# Patient Record
Sex: Male | Born: 1965 | Race: White | Hispanic: No | Marital: Married | Smoking: Former smoker
Health system: Southern US, Community
[De-identification: ages and names within clinical notes are randomized; demographics above are authoritative.]

## PROBLEM LIST (undated history)

## (undated) DIAGNOSIS — Z8719 Personal history of other diseases of the digestive system: Secondary | ICD-10-CM

## (undated) DIAGNOSIS — R531 Weakness: Secondary | ICD-10-CM

## (undated) DIAGNOSIS — F32A Depression, unspecified: Secondary | ICD-10-CM

## (undated) DIAGNOSIS — IMO0001 Reserved for inherently not codable concepts without codable children: Secondary | ICD-10-CM

## (undated) DIAGNOSIS — M1990 Unspecified osteoarthritis, unspecified site: Secondary | ICD-10-CM

## (undated) DIAGNOSIS — F419 Anxiety disorder, unspecified: Secondary | ICD-10-CM

## (undated) DIAGNOSIS — K219 Gastro-esophageal reflux disease without esophagitis: Secondary | ICD-10-CM

## (undated) DIAGNOSIS — Z9889 Other specified postprocedural states: Secondary | ICD-10-CM

## (undated) DIAGNOSIS — M545 Low back pain, unspecified: Secondary | ICD-10-CM

## (undated) DIAGNOSIS — I10 Essential (primary) hypertension: Secondary | ICD-10-CM

## (undated) DIAGNOSIS — F329 Major depressive disorder, single episode, unspecified: Secondary | ICD-10-CM

## (undated) DIAGNOSIS — G5600 Carpal tunnel syndrome, unspecified upper limb: Secondary | ICD-10-CM

## (undated) DIAGNOSIS — R112 Nausea with vomiting, unspecified: Secondary | ICD-10-CM

## (undated) DIAGNOSIS — G4730 Sleep apnea, unspecified: Secondary | ICD-10-CM

## (undated) DIAGNOSIS — G8929 Other chronic pain: Secondary | ICD-10-CM

## (undated) DIAGNOSIS — Z86010 Personal history of colonic polyps: Secondary | ICD-10-CM

## (undated) DIAGNOSIS — T07XXXA Unspecified multiple injuries, initial encounter: Secondary | ICD-10-CM

## (undated) DIAGNOSIS — R351 Nocturia: Secondary | ICD-10-CM

## (undated) DIAGNOSIS — E785 Hyperlipidemia, unspecified: Secondary | ICD-10-CM

## (undated) DIAGNOSIS — G4700 Insomnia, unspecified: Secondary | ICD-10-CM

## (undated) DIAGNOSIS — K760 Fatty (change of) liver, not elsewhere classified: Secondary | ICD-10-CM

## (undated) DIAGNOSIS — D696 Thrombocytopenia, unspecified: Secondary | ICD-10-CM

## (undated) DIAGNOSIS — G4720 Circadian rhythm sleep disorder, unspecified type: Secondary | ICD-10-CM

## (undated) DIAGNOSIS — R296 Repeated falls: Secondary | ICD-10-CM

## (undated) DIAGNOSIS — R3915 Urgency of urination: Secondary | ICD-10-CM

## (undated) DIAGNOSIS — Z8709 Personal history of other diseases of the respiratory system: Secondary | ICD-10-CM

## (undated) DIAGNOSIS — F513 Sleepwalking [somnambulism]: Secondary | ICD-10-CM

## (undated) DIAGNOSIS — M542 Cervicalgia: Secondary | ICD-10-CM

## (undated) DIAGNOSIS — R42 Dizziness and giddiness: Secondary | ICD-10-CM

## (undated) HISTORY — PX: ELBOW SURGERY: SHX618

## (undated) HISTORY — DX: Low back pain, unspecified: M54.50

## (undated) HISTORY — DX: Cervicalgia: M54.2

## (undated) HISTORY — DX: Depression, unspecified: F32.A

## (undated) HISTORY — DX: Carpal tunnel syndrome, unspecified upper limb: G56.00

## (undated) HISTORY — DX: Major depressive disorder, single episode, unspecified: F32.9

## (undated) HISTORY — PX: OTHER SURGICAL HISTORY: SHX169

## (undated) HISTORY — PX: ESOPHAGOGASTRODUODENOSCOPY: SHX1529

## (undated) HISTORY — DX: Low back pain: M54.5

## (undated) HISTORY — DX: Sleep apnea, unspecified: G47.30

## (undated) HISTORY — PX: COLONOSCOPY: SHX174

## (undated) HISTORY — PX: TONSILLECTOMY: SUR1361

## (undated) HISTORY — DX: Other chronic pain: G89.29

---

## 1999-12-19 ENCOUNTER — Other Ambulatory Visit: Admission: RE | Admit: 1999-12-19 | Discharge: 1999-12-19 | Payer: Self-pay | Admitting: Orthopedic Surgery

## 2000-08-31 ENCOUNTER — Other Ambulatory Visit: Admission: RE | Admit: 2000-08-31 | Discharge: 2000-08-31 | Payer: Self-pay | Admitting: Orthopedic Surgery

## 2001-04-27 ENCOUNTER — Encounter: Payer: Self-pay | Admitting: Emergency Medicine

## 2001-04-27 ENCOUNTER — Emergency Department (HOSPITAL_COMMUNITY): Admission: EM | Admit: 2001-04-27 | Discharge: 2001-04-27 | Payer: Self-pay | Admitting: Emergency Medicine

## 2001-07-10 ENCOUNTER — Inpatient Hospital Stay (HOSPITAL_COMMUNITY): Admission: EM | Admit: 2001-07-10 | Discharge: 2001-07-12 | Payer: Self-pay | Admitting: Emergency Medicine

## 2001-07-10 ENCOUNTER — Encounter: Payer: Self-pay | Admitting: Emergency Medicine

## 2003-10-01 ENCOUNTER — Emergency Department (HOSPITAL_COMMUNITY): Admission: EM | Admit: 2003-10-01 | Discharge: 2003-10-01 | Payer: Self-pay | Admitting: Emergency Medicine

## 2003-10-09 ENCOUNTER — Encounter (INDEPENDENT_AMBULATORY_CARE_PROVIDER_SITE_OTHER): Payer: Self-pay | Admitting: Specialist

## 2003-10-09 ENCOUNTER — Ambulatory Visit (HOSPITAL_COMMUNITY): Admission: RE | Admit: 2003-10-09 | Discharge: 2003-10-09 | Payer: Self-pay | Admitting: Gastroenterology

## 2005-06-29 HISTORY — PX: NECK SURGERY: SHX720

## 2006-05-09 ENCOUNTER — Encounter
Admission: RE | Admit: 2006-05-09 | Discharge: 2006-05-09 | Payer: Self-pay | Admitting: Physical Medicine and Rehabilitation

## 2006-06-18 ENCOUNTER — Ambulatory Visit (HOSPITAL_COMMUNITY): Admission: RE | Admit: 2006-06-18 | Discharge: 2006-06-18 | Payer: Self-pay | Admitting: Neurosurgery

## 2006-08-11 ENCOUNTER — Encounter: Admission: RE | Admit: 2006-08-11 | Discharge: 2006-08-11 | Payer: Self-pay | Admitting: Neurosurgery

## 2007-03-02 ENCOUNTER — Encounter
Admission: RE | Admit: 2007-03-02 | Discharge: 2007-03-02 | Payer: Self-pay | Admitting: Physical Medicine and Rehabilitation

## 2007-06-30 HISTORY — PX: BACK SURGERY: SHX140

## 2007-11-06 ENCOUNTER — Emergency Department (HOSPITAL_COMMUNITY): Admission: EM | Admit: 2007-11-06 | Discharge: 2007-11-06 | Payer: Self-pay | Admitting: Emergency Medicine

## 2007-12-09 ENCOUNTER — Inpatient Hospital Stay (HOSPITAL_COMMUNITY): Admission: RE | Admit: 2007-12-09 | Discharge: 2007-12-11 | Payer: Self-pay | Admitting: Neurosurgery

## 2010-02-27 ENCOUNTER — Emergency Department (HOSPITAL_COMMUNITY): Admission: EM | Admit: 2010-02-27 | Discharge: 2010-02-27 | Payer: Self-pay | Admitting: Emergency Medicine

## 2010-09-11 LAB — COMPREHENSIVE METABOLIC PANEL
AST: 83 U/L — ABNORMAL HIGH (ref 0–37)
BUN: 20 mg/dL (ref 6–23)
CO2: 24 mEq/L (ref 19–32)
Chloride: 102 mEq/L (ref 96–112)
Creatinine, Ser: 1.48 mg/dL (ref 0.4–1.5)
GFR calc Af Amer: >60 mL/min (ref >60)
GFR calc non Af Amer: 51 mL/min — ABNORMAL LOW (ref >60)
Glucose, Bld: 126 mg/dL — ABNORMAL HIGH (ref 70–99)
Potassium: 3.5 mEq/L (ref 3.5–5.1)
Sodium: 137 mEq/L (ref 135–145)
Total Bilirubin: 0.6 mg/dL (ref 0.3–1.2)
Total Protein: 7 g/dL (ref 6.0–8.3)

## 2010-09-11 LAB — CBC
MCH: 34.8 pg — ABNORMAL HIGH (ref 26.0–34.0)
MCHC: 36.1 g/dL — ABNORMAL HIGH (ref 30.0–36.0)
MCV: 96.4 fL (ref 78.0–100.0)
Platelets: 148 10*3/uL — ABNORMAL LOW (ref 150–400)
WBC: 5.7 10*3/uL (ref 4.0–10.5)

## 2010-09-11 LAB — PROTIME-INR
INR: 1.02 (ref 0.00–1.49)
Prothrombin Time: 13.6 seconds (ref 11.6–15.2)

## 2010-11-11 NOTE — Op Note (Signed)
[name], [name]          ACCOUNT NO.:  [id number]   MEDICAL RECORD NO.:  [id number]          PATIENT TYPE:  INP   LOCATION:  3012                         FACILITY:  [facility]   PHYSICIAN:  Kathaleen Maser. Pool, M.D.    DATE OF BIRTH:  01-18-1966   DATE OF PROCEDURE:  12/09/2007  DATE OF DISCHARGE:                               OPERATIVE REPORT   PREOPERATIVE DIAGNOSIS:  L4-5 and L5-S1 degenerative disk disease and  spondylosis with foraminal stenosis.   POSTOPERATIVE DIAGNOSIS:  L4-5 and L5-S1 degenerative disk disease and  spondylosis with foraminal stenosis.   PROCEDURES:  L4-5 and L5-S1 decompressive laminectomy with L4, L5, and  S1 foraminotomies, more than what would be required for simple interbody  fusion alone.  L4-5 and L5-S1 posterior lumbar with utilizing tangent  interbody allograft wedge, Telamon nterbody PEEK cage and local  autografting.  L4-5 posterolateral arthrodesis utilizing segmental  pedicle fixation and local autograft.   SURGEON:  Kathaleen Maser. Pool, MD.   ASSISTANT:  Tia Alert, MD   ANESTHESIA:  General endotracheal.   INDICATION:  Mr. Brander is a 45 year old male with a history of severe  back and left lower extremity pain, paresthesias, weakness, failing  conservative management.  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  The patient presents now for 2-  level lumbar decompression and fusion with instrumentation.   OPERATIVE PROCEDURE:  The patient placed on the operative table in  supine position.  After a level of anesthesia achieved, the patient was  placed prone on Wilson frame firmly padded  The patient's lumbar regions  prepped and draped.  A 10-blade was used to make a curvilinear skin  incision overlying the L3-4, L5, and S1 levels.  This was carried down  sharply in midline.  Subperiosteal dissection was then performed  exposing  the lamina and facet joints L3-L4, L5, and S1 as well as the  transverse processes of L4-L5 and there was a S1 bilaterally.  Deep self-  retaining retractor was placed.  Intraoperative fluoroscopy was used and  levels were confirmed.  Decompressive laminectomies were then performed  using Leksell rongeurs, Kerrison rongeurs, and high-speed drill to  completely remove the lamina at L4, completely remove the lamina of L5,  and removed the superior aspect of lamina of S1.  Inferior facetectomies  were performed bilaterally at L4-L5.  Superior facetectomies performed  bilaterally at L5-S1.  Ligament flavum was then elevated and resected.  These  fashion using Kerrison rongeurs.  Wide decompressive  foraminotomies were then performed along course exiting L4, L5, and S1  nerve roots.  Epidural venous plexus were coagulated and cut.  Starting  first at L4-5 on the right side, thecal sac and nerve roots gently  mobilized and tracked towards the left.  The disk space was incised with  15 blade in a rectangular fashion.  Wide disk space clean-out was  achieved using pituitary rongeurs, upbiting pituitary rongeurs, and  Epstein curettes.  Procedure was then repeated on the contralateral  side.  Disk space then  sequentially dilated up to 10 mm.  The 10-mm  distractor was left in the patient's left side.  Thecal sac and nerve  roots were checked on the right side.  Disk space then reamed and cut  with 10 mm tangent instruments.  Soft tissues were then removed from the  interspace.  A 10 x 26 mm  Telamon cage packed with morselized autograft  and Progenix putty was then packed into place and recessed roughly 1 mm  from posterior cortical margin of L4.  The distractor was removed from  the patient's left side.  Thecal sac and nerve roots were checked on the  left side.  Disk space was once again reamed and then cut with a 10-mm  tangent instrument. Soft tissues then removed from the interspace.  Disk   space was further curettaged.  Morselized autograft mixed with Progenix  putty was then packed in the interspace. A 10 x 26 tangent wedge was  then packed into place and recessed approximately 1 mm from posterior  cortical margin at L4.  The procedure then repeated at L5-S1 again  without complication.  Pedicle screws were placed bilaterally at L4, L5,  and S1 by first selecting entry point using surface landmarks and  intraoperative fluoroscopy.  The intracord was then drilled using high-  speed drill.  The intracord was then widened using a pedicle awl.  Each  pedicle awl track was then tapped with 4.25 mm screw tap.  Screw tap  hole was probed and found to be solid within the bone.  A 6.75 x 45 mm  radius screws placed bilaterally at L4.  6.75 x 40 mm screws placed  bilaterally at L5 and S1.  Transverse processes and sacral ala were then  decorticated with high speed drill.  Morselized autograft was packed  posterolaterally for later fusion.  Short segment titanium rods were  then contoured, placed over screw heads at L4-5 and S1.  Locking caps  placed over screw heads.  The locking caps were then engaged with  construct under compression.  Transverse connector was placed.  Final  images revealed good position of the bone grafts and hardware with  normal alignment of the spine. Wound was then irrigated with antibiotic  solution.  Gelfoam was placed topically.  Hemostasis found to be good.  A medium Hemovac drain was left at upper spacer.  Wound was then closed  in layers with Vicryl suture.  Steri-Strips and sterile dressings were  applied.  There were no complications.  The patient tolerated the  procedure well.  He returned to recovery room for postoperative care.           ______________________________  Kathaleen Maser. Pool, M.D.     HAP/MEDQ  D:  12/09/2007  T:  12/10/2007  Job:  [id number]

## 2010-11-14 NOTE — H&P (Signed)
[facility]  Patient:    ZYAIR, RUSSI Visit Number: [id number] MRN: [id number]          Service Type: MED Location: 1E 0101 01 Attending Physician:  Donnetta Hutching Dictated by:   Veneda Melter, M.D. Admit Date:  07/10/2001   CC:         Louanna Raw, M.D.   History and Physical  BRIEF HISTORY:  Mr. Sheeley is a 45 year old white male without known history of coronary disease who presents with substernal chest discomfort. The patient has had a 3 to 4 month history of dyspnea with exertion and mild substernal chest discomfort also with exertion for which he has not sought medical attention.  In the past 2 days he has noted several fleeting episodes at rest and this morning while working on his computer had significant substernal chest pain with radiation to the back.  This was associated with diaphoresis, lightheadedness, nausea and shortness of breath.  The patient could not quite get his wind.  He presented to the emergency room and was stabilized medically.  He is currently completely pain free.  In retrospect Mr. Maull recalls having some palpitations with exertion.  He felt light-headed this morning but has not had any prior episodes of lightheadedness, no syncope nor orthopnea, paroxysmal nocturnal dyspnea, lower extremity edema.  He has not had any transient ischemic attacks or strokes.  REVIEW OF SYSTEMS:  Otherwise noncontributory.  PAST MEDICAL HISTORY: 1. Notable for hypertension, hypercholesterolemia.  He has had total    cholesterol of 331 with triglycerides of 1327 approximately 1 year ago.  He    had been tried on lipitor and did not feel well on this and has been on    Welchol since without recheck. 2. Several bilateral elbow and forearm surgeries. 3. He also has a history of migraines. 4. Gastroesophageal reflux disease.  ALLERGIES:  SULFA which causes a rash.  ANCEF also causes rash and hot flashes.  CURRENT  MEDICATIONS: 1. Norvasc 5 mg q.d. 2. Protonix 40 mg q.d. 3. Welchol 6 tablets q.d. 4. Zoloft 150 to 200 mg q.d. 5. Midrin and Valium as needed for migraines.  SOCIAL HISTORY:  The patient is married and has 1 daughter 19 years old.  He has a history of tobacco use of 1 pack per week for 22 years.  Quit 3 years ago.  He uses alcohol in moderation 1 to 2 drinks per day, more on weekends but denies excessive alcohol use.  He denies elicit drug use.  FAMILY HISTORY:  Father alive at the age of 40, had 3 myocardial infarctions. He has had coronary artery bypass graft surgery as well. Mother is alive, unknown age.  No history of coronary disease.  He has 1 brother with hypertension and hypercholesterolemia.  He has several aunts and uncles with coronary artery disease.  PHYSICAL EXAMINATION:  GENERAL:  He is a well-developed well-nourished white male in no acute distress.  VITAL SIGNS:  Blood pressure 120/80, pulse 64 normal sinus, respirations 18, O2 saturation 99% on 2 liters per minute.  HEENT:  Pupils, equal, round, reactive to light.  Extraocular muscles intact. Oropharynx shows no lesions.  NECK:  Carotid upstrokes are brisk without bruits.  There is no cervical adenopathy.  HEART:  Regular rate and rhythm without murmurs.  LUNGS:  Clear to auscultation.  ABDOMEN:  Soft, nontender, no masses.  EXTREMITIES:  There is no peripheral edema.  Pulses are 3+ and equal bilaterally.  Motor strength is 5/5,  sensory is intact to touch.  CRANIAL NERVES II-XII:  Intact.  SKIN:  No rashes.  LABORATORY DATA:  CK is 78, MB fraction is 1.0, troponin I is less than 0.01. White count 6.1, hemoglobin 15.8, hematocrit 44.8, platelets 233, sodium 137, potassium 4.1, chloride 107.  Bicarbonate 25, BUN 13, creatinine 1.3, glucose of 98.  ECG shows normal sinus rhythm at 68 with no acute ischemic changes.  ASSESSMENT AND PLAN:  The patient is a 45 year old gentleman without prior cardiac  history who presents with a 3 to 4 month history of progressive shortness of breath and chest pain with exertion.  This is now progressed to pain at rest with nausea, diaphoresis, palpitations and shortness of breath.  The patient has a significant history of dyslipidemia, hypertension and a strong family history of coronary disease.  Given these findings he has high pretest probability at this point.  Initial enzymes and ECGs do suggest any acute injury or ischemia. The patient will be admitted to the hospital.  He will be stabilized medically and ruled out for acute myocardial infarction. We will then proceed with cardiac catheterization.  The patient is familiar with treatment options including stress imaging studies and he wishes to proceed with this.  The risks, benefits and alternatives of left heart catheterization and possible percutaneous intervention were discussed with the patient and his wife.  They understand and wish to proceed. Will also repeat his fasting lipid profile and if this grossly abnormal consider referral to our lipid clinic for further treatment. Dictated by:   Veneda Melter, M.D. Attending Physician:  Donnetta Hutching DD:  07/10/01 TD:  07/10/01 Job: [id number]

## 2010-11-14 NOTE — Op Note (Signed)
NAME:  [name], [name]                    ACCOUNT NO.:  [id number]   MEDICAL RECORD NO.:  [id number]                   PATIENT TYPE:  AMB   LOCATION:  ENDO                                 FACILITY:  [facility]   PHYSICIAN:  John C. Madilyn Fireman, M.D.                 DATE OF BIRTH:  1965-09-14   DATE OF PROCEDURE:  10/09/2003  DATE OF DISCHARGE:                                 OPERATIVE REPORT   PROCEDURE:  Colonoscopy with biopsy.   INDICATIONS FOR PROCEDURE:  Abdominal pain and diarrhea.   DESCRIPTION OF PROCEDURE:  The patient was placed in the left lateral  decubitus position and placed on the pulse monitor with continuous low-flow  oxygen delivered by nasal cannula.  He was sedated with 50 mcg IV fentanyl  and 4 mg IV Versed in addition to that for the previous EGD.  The Olympus  video colonoscope was inserted into the rectum and advanced to the cecum,  confirmed by transillumination of McBurney's point and visualization of the  ileocecal valve and appendiceal orifice.  Prep was excellent.  The terminal  ileum was intubated and about the distal 3-4 cm visualized which appeared to  be within normal limits.  The cecum, ascending, transverse, descending,  sigmoid colon all appeared normal with no masses, polyps, diverticula, or  other mucosal abnormalities.  The rectum likewise appeared normal and rectal  biopsies were obtained to rule out microscopic colitis.  The scope was then  withdrawn and the patient returned to the recovery room in stable condition.  He tolerated the procedure well and there were no immediate complications.   IMPRESSION:  Normal colonoscopy.   PLAN:  Await biopsy results to rule out microscopic colitis.                                               John C. Madilyn Fireman, M.D.    JCH/MEDQ  D:  10/09/2003  T:  10/09/2003  Job:  [id number]   cc:   Louanna Raw  [address]  [place]  [place] [address]  Fax: [phone]

## 2010-11-14 NOTE — Cardiovascular Report (Signed)
[place]  Patient:    [name], [name] Visit Number: [id number] MRN: [id number]          Service Type: MED Location: 3W [phone] Attending Physician:  Veneda Melter Dictated by:   Doylene Canning. Ladona Ridgel, M.D. [facility] Proc. Date: 07/11/01 Admit Date:  07/10/2001 Discharge Date: 07/11/2001   CC:         Louanna Raw, M.D.  Veneda Melter, M.D. [facility]   Cardiac Catheterization  PROCEDURE:  Left heart catheterization, coronary angiography, and left ventriculography.  INDICATIONS:  Recurrent and unexplained chest pain in a patient with multiple risk factors for coronary disease.    I. INTRODUCTION:  The patient is a very pleasant 45 year old man with a      positive family history of coronary artery disease who was admitted to      the hospital with substernal chest pain.  He is now referred for left      heart catheterization.  II. DESCRIPTION OF PROCEDURE:  After informed consent was obtained, the      patient was taken to the diagnostic catheterization lab in the fasting      state.  After the usual preparation and draping, intravenous midazolam      was given for sedation.  Lidocaine, 20 cc, was infiltrated in the right      groin region.  The right femoral artery was punctured and a #6 [place]      hemostatic sheath was placed in the artery.  The left Judkins catheter      was inserted percutaneously in the right femoral artery and advanced      into the left main coronary artery.  Coronary angiography of the left      main system was then carried out.  The left Judkins catheter was      removed and the right Judkins catheter was inserted into the right      coronary artery, and coronary angiography of the right coronary system      was carried out.  Next, the right Judkins catheter was removed and the      pigtail catheter was inserted retrograde across the aortic valve into      the left ventricle.  Left ventriculography in the RAO projection was     then carried out.  At this point, the catheters were removed.      Hemostasis was assured and the patient was returned to his room in      satisfactory condition. III. COMPLICATIONS:  None.  IV. RESULTS:      A. Hemodynamics:  Left ventricular pressure was 122/11.  The aortic         pressure was 123/84.      B. Left ventriculography:  Left ventriculography was performed in the         RAO projection with a total of 30 cc of contrast delivered at a rate         of 12 cc per second.  The LV function was normal with no segmental         wall motion abnormalities.      C. Coronary angiography:  The left main coronary artery gave rise to the         left anterior descending and left circumflex coronary arteries.  The         left anterior descending was free of significant angiographic disease.         It gave rise to  two diagonal branches which were also free of         significant coronary disease.  The left circumflex branch gave rise         to three obtuse marginal branches, all of whom were free of         significant coronary disease.  The right coronary artery was a         dominant vessel.  It was somewhat diminutive in the segment of the         PDA but no obvious focal stenosis.   V. This study demonstrates normal coronary arteries, preserved LV systolic      function with normal LV end diastolic filling pressures in a patient with      recurrent chest pain and multiple risk factors for coronary artery      disease.Dictated by:   Doylene Canning. Ladona Ridgel, M.D. [facility] Attending Physician:  Veneda Melter DD:  07/11/01 TD:  07/11/01 Job: [id number]

## 2010-11-14 NOTE — Discharge Summary (Signed)
[place]  Patient:    CYAN, MOULTRIE Visit Number: [id number] MRN: [id number]          Service Type: MED Location: 3W [phone] Attending Physician:  Veneda Melter Dictated by:   Lavella Hammock, P.A.-C. Admit Date:  07/10/2001 Discharge Date: 07/11/2001   CC:         Dr. Earlene Plater, [facility], [place], [place]   Referring Physician Discharge [facility]  DATE OF BIRTH:  Oct 14, 1965  PROCEDURES: 1. Cardiac catheterization. 2. Coronary arteriogram. 3. Left ventriculogram.  HOSPITAL COURSE:  Mr. Fandrich is a 45 year old male with no known history of coronary artery disease, who came to the emergency room for evaluation of chest pain associated with mild shortness of breath.  He was admitted to rule out MI and for further evaluation.  Because of the history of premature coronary artery disease in multiple relatives as well as a personal history of hypertension and hypertriglyceridemia, he was scheduled for a cardiac catheterization.  The cardiac catheterization was done on July 11, 2001. The cardiac catheterization showed normal coronary arteries with preserved left ventricular function and normal ______  filling pressures.  The patient had no further recurrence of pain, and his laboratory values were within normal limits.  A TSH was checked, and it was normal, and an ______ was mildly elevated at 48, but other LFTs were within normal limits.  His white count was not high.  Because the patient had no coronary artery disease by catheterization with normal left ventricular function, pending completion of bed rest and his groin being stable after ambulation, he was considered stable for discharge on July 11, 2001.  LABORATORY VALUES:  Total cholesterol 235, triglycerides 559, HDL 38, LDL not calculated.  DISCHARGE CONDITION:  Stable.  CONSULTS:  None.  COMPLICATIONS:  None.  DISCHARGE DIAGNOSES: 1. Chest pain, no coronary  artery disease disease by catheterization, possibly    secondary to reflux. 2. Hypertension. 3. Hyperlipidemia. 4. Family history of premature coronary artery disease. 5. History of ALLERGIES to SULFA and ANCEF.  DISCHARGE INSTRUCTIONS: 1. His is to do no driving, sexual or strenuous activity  for two days. 2. He is to stick to a low fat diet. 3. He is to call the office for problems with the cath site. 4. He has an appointment on July 25, 2001, at 10:15, with Dian Queen for    Dr. Chales Abrahams which he may cancel if he sees Dr. Earlene Plater in the meantime. 5. He is to follow up with Dr. Earlene Plater at [facility] and call for    an appointment.  DISCHARGE MEDICATIONS: 1. Norvasc 5 mg q.d. 2. Protonix 40 mg q.d. 3. Welchol 6 tablets q.d. 4. Zoloft 150-100 mg q.d. 5. Altace 2.5 mg q.d. which is new. Dictated by:   Lavella Hammock, P.A.-C. Attending Physician:  Veneda Melter DD:  07/11/00 TD:  07/11/01 Job: [id number]

## 2010-11-14 NOTE — Op Note (Signed)
NAME:  [name], [name]                    ACCOUNT NO.:  [id number]   MEDICAL RECORD NO.:  [id number]                   PATIENT TYPE:  AMB   LOCATION:  ENDO                                 [facility]ACILITY[facility]   PHYSICIAN:  John C. Madilyn Fireman, M.D.                 DATE OF BIRTH:  09-30-1965   DATE OF PROCEDURE:  10/09/2003  DATE OF DISCHARGE:                                 OPERATIVE REPORT   PROCEDURE:  Esophagogastroduodenoscopy with biopsy.   INDICATION FOR PROCEDURE:  Abdominal pain, reflux symptoms with known  gallstones and suspicion of cirrhosis of the liver as well as chronic  diarrhea.   DESCRIPTION OF PROCEDURE:  The patient was placed in the left lateral  decubitus position and placed on the pulse monitor with continuous low-flow  oxygen delivered by nasal cannula.  He was sedated with 100 mcg IV fentanyl  and 10 mg IV Versed.  The Olympus video endoscope was advanced under direct  vision into the oropharynx and esophagus.  The esophagus was straight and of  normal caliber with the squamocolumnar line at 36 cm above a 3 cm hiatal  hernia.  There was no visible esophagitis and no visible esophageal varices.  The stomach was entered, and a small amount of liquid secretions were  suctioned from the fundus.  Retroflexed view of the cardia was confirmed a  hiatal hernia and was otherwise unremarkable.  The fundus and body appeared  normal.  The antrum showed patches of erythema and granularity with no  discrete ulcer or erosion.  The pylorus was nondeformed and easily allowed  passage of the endoscope tip into the duodenum.  Both the bulb and second  portion were well-inspected and appeared to be within normal limits.  The  scope was withdrawn back into the stomach and CLOtest obtained.  The scope  was then withdrawn and the patient returned to the recovery room in stable  condition.  He tolerated the procedure well, and there were no immediate  complications.   IMPRESSION:  1.  Hiatal hernia with patulous gastroesophageal junction.  2. Antral gastritis.  3. No visible esophageal varices.   PLAN:  1. Increase Nexium to b.i.d. dosing and await CLOtest.  2. Will proceed with colonoscopy based on his abdominal pain and diarrhea to     rule out inflammatory bowel disease.                                               John C. Madilyn Fireman, M.D.    JCH/MEDQ  D:  10/09/2003  T:  10/09/2003  Job:  [id number]   cc:   Louanna Raw  [address]  [place]  [place] [address]  Fax: [phone]

## 2010-11-14 NOTE — Discharge Summary (Signed)
[place]. [facility]  Patient:    [name], [name] Visit Number: [id number] MRN: [id number]          Service Type: OBV Location: [id number] Attending Physician:  Lewayne Bunting Dictated by:   Delton See, P.A. Admit Date:  07/11/2001 Discharge Date: 07/12/2001   CC:         [facility]   Referring Physician Discharge [place]  ADDENDUM  DATE OF BIRTH:  12/24/1965  HISTORY:  Please see previous dictation.  [name], in brief, is a 45 year old male who was admitted for evaluation of chest pain.  He underwent cardiac catheterization on July 11, 2001 revealing normal coronary arteries and normal left ventricle.  He has a history of hypertension.  He had some nonsustained ventricular tachycardia during his stay; however, with a normal left ventricle it was not felt that this needed to be treated at this time. The patient has previously been intolerant to BETA BLOCKERS secondary to fatigue.  He also has a history of elevated cholesterols and triglycerides, and gastroesophageal reflux disease.  HOSPITAL COURSE:  The patient developed a right groin hematoma following his catheterization.  He was to be discharged that evening.  However, because of the hematoma he was kept an additional evening.  An ultrasound was performed on that artery the following day.  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  Arrangements were made to discharge the patient home later that day.  DISCHARGE MEDICATIONS: 1. Norvasc 5 mg daily. 2. Protonix 40 mg daily. 3. WelChol six tablets daily. 4. Zoloft as previously taken. 5. Midrin and Valium as previously taken.  ACTIVITY:  The patient was told to avoid any strenuous activity or driving for two days.  He was not to do any lifting for at least one weeks.  DIET:  He was to be on a low salt/low fat diet.  WOUND CARE:  He told to call the office if he had any  increased pain, swelling, or bleeding from his groin.  FOLLOW-UP:  The patient was scheduled to be seen in the lipid clinic on Monday, July 25, 2001 at 2 p.m.  He would follow up with Dr. Chales Abrahams August 15, 2001 at 2:15 p.m.  He was to see Dr. Louanna Raw as needed or as scheduled.  PROBLEM LIST AT TIME OF DISCHARGE: 1. Cardiac catheterization on July 11, 2001 revealing normal coronary    arteries, normal left ventricle. 2. History of hypertension. 3. Nonsustained ventricular tachycardia this admission. 4. History of elevated cholesterol and triglycerides. 5. Mild hypokalemia, treated. 6. Post catheterization hematoma of the right groin with ultrasound revealing    arteriovenous fistula but no pseudoaneurysm. 7. History of gastroesophageal reflux disease. 8. History of migraine headaches.  NOTE:  The patient probably should have a basic metabolic panel checked at his next office visit secondary to the hypokalemia he was noted to have during this stay.  As previously dictated, the patient did receive potassium supplement. Dictated by:   Delton See, P.A. Attending Physician:  Lewayne Bunting DD:  07/12/01 TD:  07/12/01 Job: [id number]

## 2010-11-14 NOTE — Op Note (Signed)
[name], [name]          ACCOUNT NO.:  [id number]   MEDICAL RECORD NO.:  [id number]          PATIENT TYPE:  AMB   LOCATION:  SDS                          FACILITY:  [facility]   PHYSICIAN:  Henry A. Pool, M.D.    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   PROCEDURE NAME:  C5-6 anterior cervical diskectomy and fusion with  allograft and plating.   SURGEON:  Kathaleen Maser. Pool, M.D.   ASSISTANT:  Donalee Citrin, M.D.   ANESTHESIA:  General orotracheal.   INDICATIONS:  [name] is a 45 year old male with history of neck pain  and bilateral upper extremity symptoms, consistent with both C6  radiculopathy bilaterally and some degree of a cervical myelopathy as  well.  Workup has demonstrated evidence of a broad-based disk herniation  with associated spondylosis biased off to the right side at C5-6, with  spinal stenosis and neural foraminal stenosis bilaterally.  The patient  had been counseled as to his options.  He has decided to proceed with C5-  6 anterior cervical diskectomy and fusion with allograft and anterior  plating -- in hopes of improving his symptoms.   OPERATIVE NOTE:  The patient was brought to the operating room; placed  supine on the table.  After adequate level of anesthesia achieved, with  the patient supine and the neck slightly extended and held in place with  halter traction.  The patient's anterior cervical region was prepped and  draped sterilely.  A #10 blade was used to make a linear skin incision  overlying the C5-6 interspace.  This was carried down sharply to the  platysma.  The platysma was then divided vertically and dissection  proceeded along the medial border of the sternomastoid muscle and  carotid sheath.  Trachea  and esophagus were mobilized and retracted  towards the left.  Prevertebral fascia was stripped off the anterior  spinal column.  The longus coli muscle was then elevated bilaterally  using electrocautery.  Deep self-retaining retractor placed.  Intraoperative fluoroscopy was used and C5-6 level was confirmed.  Disk  space was then incised with a 15 blade in a rectangular fashion.  A wide  disk space clean-out was achieved using pituitary rongeurs, __________  curets, Kerrison rongeurs and a high-speed drill.  All elements of the  disk were removed down to the level of posterior annulus.  Microscope  was then brought into the field and used throughout the remainder of the  diskectomy.  Remaining aspects of the annulus and osteophytes were  removed using the high-speed drill, down to the level of the posterior  longitudinal ligament.  The posterior longitudinal ligament was then  elevated and resected in piecemeal fashion, using Kerrison rongeurs.  The underlying thecal sac was then identified.  A wide central  decompression was then performed by undercutting the bodies of C5 and  C6, completely removing the disk herniation and spondylitic protrusion.  Decompression then proceeded out at each neural foramen.  Wide anterior  foraminotomy was then performed along the course exiting C6 nerve roots  bilaterally.  At this point a very thorough decompression was then  achieved.  There was no evidence of injury to the thecal sac or nerve  roots.  The wound was then irrigated with antibiotic solution.  A 6 mm  __________ allograft wedge was then packed into place and resected  approximately 1 ml from the anterior cortical margin at C5-6.  A 25 mm  Atlantis anterior cervical plate was then placed over the C5 and C6  levels.  This was then attached under fluoroscopic guidance using 13 mm  variable screws; 2 each at both levels.  Final images revealed good  position __________  spine.  Locking screws  were engaged at both levels.  The wound was then inspected for hemostasis and found to be good.  It  was then closed in typical fashion.  Steri-Strips and sterile dressing  were applied.  There were no apparent complications.  The patient well  and he returns to recovery room postoperatively.           ______________________________  Kathaleen Maser Pool, M.D.     HAP/MEDQ  D:  06/18/2006  T:  06/18/2006  Job:  [id number]

## 2011-03-16 ENCOUNTER — Other Ambulatory Visit: Payer: Self-pay | Admitting: Physical Medicine and Rehabilitation

## 2011-03-16 DIAGNOSIS — M4800 Spinal stenosis, site unspecified: Secondary | ICD-10-CM

## 2011-03-16 DIAGNOSIS — M75100 Unspecified rotator cuff tear or rupture of unspecified shoulder, not specified as traumatic: Secondary | ICD-10-CM

## 2011-03-26 LAB — CBC
MCHC: 35.7
RBC: 4.47
RDW: 12.6
WBC: 6.4

## 2011-03-26 LAB — BASIC METABOLIC PANEL
CO2: 27
Calcium: 10.3
Creatinine, Ser: 1.2
GFR calc non Af Amer: >60
Potassium: 4.4

## 2011-03-26 LAB — DIFFERENTIAL
Basophils Absolute: 0
Basophils Relative: 0
Lymphocytes Relative: 23
Neutro Abs: 4.5

## 2011-03-26 LAB — TYPE AND SCREEN
ABO/RH(D): O NEG
Antibody Screen: NEGATIVE

## 2011-05-21 ENCOUNTER — Emergency Department (HOSPITAL_COMMUNITY): Payer: PRIVATE HEALTH INSURANCE

## 2011-05-21 ENCOUNTER — Emergency Department (HOSPITAL_COMMUNITY)
Admission: EM | Admit: 2011-05-21 | Discharge: 2011-05-21 | Disposition: A | Discharge status: Home / Self Care | Payer: PRIVATE HEALTH INSURANCE | Attending: Emergency Medicine | Admitting: Emergency Medicine

## 2011-05-21 ENCOUNTER — Encounter: Payer: Self-pay | Admitting: Emergency Medicine

## 2011-05-21 DIAGNOSIS — R109 Unspecified abdominal pain: Secondary | ICD-10-CM | POA: Insufficient documentation

## 2011-05-21 DIAGNOSIS — R5381 Other malaise: Secondary | ICD-10-CM | POA: Insufficient documentation

## 2011-05-21 DIAGNOSIS — I10 Essential (primary) hypertension: Secondary | ICD-10-CM | POA: Insufficient documentation

## 2011-05-21 DIAGNOSIS — R29898 Other symptoms and signs involving the musculoskeletal system: Secondary | ICD-10-CM

## 2011-05-21 DIAGNOSIS — M129 Arthropathy, unspecified: Secondary | ICD-10-CM | POA: Insufficient documentation

## 2011-05-21 DIAGNOSIS — R1110 Vomiting, unspecified: Secondary | ICD-10-CM

## 2011-05-21 DIAGNOSIS — R55 Syncope and collapse: Secondary | ICD-10-CM | POA: Insufficient documentation

## 2011-05-21 DIAGNOSIS — Z79899 Other long term (current) drug therapy: Secondary | ICD-10-CM | POA: Insufficient documentation

## 2011-05-21 DIAGNOSIS — J45909 Unspecified asthma, uncomplicated: Secondary | ICD-10-CM | POA: Insufficient documentation

## 2011-05-21 DIAGNOSIS — E119 Type 2 diabetes mellitus without complications: Secondary | ICD-10-CM | POA: Insufficient documentation

## 2011-05-21 DIAGNOSIS — R197 Diarrhea, unspecified: Secondary | ICD-10-CM | POA: Insufficient documentation

## 2011-05-21 DIAGNOSIS — E785 Hyperlipidemia, unspecified: Secondary | ICD-10-CM | POA: Insufficient documentation

## 2011-05-21 DIAGNOSIS — K219 Gastro-esophageal reflux disease without esophagitis: Secondary | ICD-10-CM | POA: Insufficient documentation

## 2011-05-21 DIAGNOSIS — R112 Nausea with vomiting, unspecified: Secondary | ICD-10-CM | POA: Insufficient documentation

## 2011-05-21 HISTORY — DX: Essential (primary) hypertension: I10

## 2011-05-21 HISTORY — DX: Unspecified osteoarthritis, unspecified site: M19.90

## 2011-05-21 HISTORY — DX: Hyperlipidemia, unspecified: E78.5

## 2011-05-21 LAB — DIFFERENTIAL
Basophils Absolute: 0 10*3/uL (ref 0.0–0.1)
Lymphocytes Relative: 9 % — ABNORMAL LOW (ref 12–46)
Lymphs Abs: 0.7 10*3/uL (ref 0.7–4.0)
Monocytes Absolute: 0.3 10*3/uL (ref 0.1–1.0)
Monocytes Relative: 4 % (ref 3–12)
Neutro Abs: 6.8 10*3/uL (ref 1.7–7.7)

## 2011-05-21 LAB — CBC
HCT: 45.2 % (ref 39.0–52.0)
Hemoglobin: 16.7 g/dL (ref 13.0–17.0)
RBC: 4.72 MIL/uL (ref 4.22–5.81)
RDW: 12.6 % (ref 11.5–15.5)
WBC: 7.9 10*3/uL (ref 4.0–10.5)

## 2011-05-21 LAB — COMPREHENSIVE METABOLIC PANEL
AST: 49 U/L — ABNORMAL HIGH (ref 0–37)
CO2: 22 mEq/L (ref 19–32)
Chloride: 99 mEq/L (ref 96–112)
Creatinine, Ser: 1.09 mg/dL (ref 0.50–1.35)
GFR calc non Af Amer: 79 mL/min — ABNORMAL LOW (ref >90)
Total Bilirubin: 1 mg/dL (ref 0.3–1.2)

## 2011-05-21 MED ORDER — PROMETHAZINE HCL 25 MG PO TABS: 25.0000 mg | ORAL_TABLET | Freq: Four times a day (QID) | ORAL | Status: AC | PRN

## 2011-05-21 NOTE — ED Notes (Signed)
Pt reports that he has noticed blood in stool as well

## 2011-05-21 NOTE — ED Notes (Addendum)
Pt to ED for eval of n/v/d/ syncope; pt reports that he has been having these episodes every few months; pt reports that he starts to have a metallic taste in his mouth (which started yesterday), then he this AM at 6, he reports that he started to have n/v/d; pt has not been able to keep food down, last time eaten was last night; pt also reports that he has been loosing the use of his R arm which started 6 months; pt reports that he has seen MD in past- and was told to take immodium, drink fluids, and he had some blood/urine tests done; pt is a&oX4, grips equal, pt able to MAE and push against resistance

## 2011-05-21 NOTE — ED Provider Notes (Signed)
History     CSN: [id number] Arrival date & time: 05/21/2011  5:07 PM   First MD Initiated Contact with Patient 05/21/11 1737      Chief Complaint  Patient presents with  . Nausea  . Diarrhea  . Loss of Consciousness    syncope- last time was on Monday    (Consider location/radiation/quality/duration/timing/severity/associated sxs/prior treatment) HPI Comments: For the past year, patient has been having episodes of severe diarrhea, abd cramping.  This returned yesterday.  Also he has been losing strength/use of his right arm over this same time period.  He has less strength and less fine motor skills than before and is getting worse as well.  Had MRI several months back that showed pinched nerve but was not thought to be the cause of this.  Patient is a 45 y.o. male presenting with diarrhea and syncope. The history is provided by the patient.  Diarrhea The primary symptoms include fatigue, abdominal pain, nausea, vomiting and diarrhea. Primary symptoms do not include fever or weight loss. The illness began 2 days ago. The onset was sudden. The problem has been rapidly worsening.  The illness does not include chills.  Loss of Consciousness Associated symptoms include abdominal pain.    Past Medical History  Diagnosis Date  . Hypertension   . Reflux   . Hyperlipemia   . Diabetes mellitus     borderline- was recently started on meds  . Arthritis   . Asthma     mild    Past Surgical History  Procedure Date  . Back surgery   . Elbow surgery   . Neck surgery     fusion    History reviewed. No pertinent family history.  History  Substance Use Topics  . Smoking status: Former Smoker -- 20 years    Quit date: 05/20/1997  . Smokeless tobacco: Not on file  . Alcohol Use: Yes     on occasion      Review of Systems  Constitutional: Positive for fatigue. Negative for fever, chills and weight loss.  Cardiovascular: Positive for syncope.  Gastrointestinal: Positive for  nausea, vomiting, abdominal pain and diarrhea.  All other systems reviewed and are negative.    Allergies  Ancef; Iohexol; and Sulfa drugs cross reactors  Home Medications   Current Outpatient Rx  Name Route Sig Dispense Refill  . ALBUTEROL SULFATE HFA 108 (90 BASE) MCG/ACT IN AERS Inhalation Inhale 2 puffs into the lungs every 6 (six) hours as needed.      Marland Kitchen AMLODIPINE BESYLATE 10 MG PO TABS Oral Take 10 mg by mouth daily.      Marland Kitchen ESOMEPRAZOLE MAGNESIUM 40 MG PO CPDR Oral Take 40 mg by mouth daily before breakfast.      . LISINOPRIL-HYDROCHLOROTHIAZIDE 20-25 MG PO TABS Oral Take 1 tablet by mouth daily.      . SERTRALINE HCL 100 MG PO TABS Oral Take 200 mg by mouth daily.        BP 125/77  Pulse 85  Temp(Src) 98.8 F (37.1 C) (Oral)  Resp 25  Ht 5\' 9"  (1.753 m)  Wt 224 lb (101.606 kg)  BMI 33.08 kg/m2  SpO2 98%  Physical Exam  Constitutional: He is oriented to person, place, and time. He appears well-developed and well-nourished. No distress.  HENT:  Head: Normocephalic and atraumatic.  Mouth/Throat: Oropharynx is clear and moist.  Eyes: EOM are normal. Pupils are equal, round, and reactive to light.  Neck: Normal range of motion. Neck supple.  Cardiovascular: Normal rate and regular rhythm.  Exam reveals no gallop and no friction rub.   No murmur heard. Pulmonary/Chest: Effort normal and breath sounds normal. No respiratory distress.  Abdominal: Soft. Bowel sounds are normal. He exhibits no distension. There is no tenderness.  Musculoskeletal: Normal range of motion. He exhibits no edema.  Lymphadenopathy:    He has no cervical adenopathy.  Neurological: He is alert and oriented to person, place, and time. No cranial nerve deficit. Coordination normal.       Biceps flexion is 4+/5 in the right upper extremity, 5/5 in the left upper extremity.  Skin: Skin is warm and dry. He is not diaphoretic.    ED Course  Procedures (including critical care time)   Labs Reviewed   CBC  DIFFERENTIAL  COMPREHENSIVE METABOLIC PANEL  LIPASE, BLOOD   No results found.   No diagnosis found.    MDM  CT's of head and abdomen reveal no obvious pathology to explain the symptoms.  Labs are okay.  Patient needs to follow up with pcp.  Will give phenergan for nausea.        Geoffery Lyons, MD 05/21/11 [phone]

## 2012-11-01 ENCOUNTER — Other Ambulatory Visit: Payer: Self-pay | Admitting: Family Medicine

## 2012-11-01 DIAGNOSIS — R160 Hepatomegaly, not elsewhere classified: Secondary | ICD-10-CM

## 2012-11-04 ENCOUNTER — Ambulatory Visit
Admission: RE | Admit: 2012-11-04 | Discharge: 2012-11-04 | Disposition: A | Discharge status: Home / Self Care | Payer: PRIVATE HEALTH INSURANCE | Source: Ambulatory Visit | Attending: Family Medicine | Admitting: Family Medicine

## 2012-11-04 DIAGNOSIS — R160 Hepatomegaly, not elsewhere classified: Secondary | ICD-10-CM

## 2013-01-03 ENCOUNTER — Institutional Professional Consult (permissible substitution): Payer: Self-pay | Admitting: Neurology

## 2013-01-20 ENCOUNTER — Encounter: Payer: Self-pay | Admitting: Neurology

## 2013-06-29 DIAGNOSIS — Z8709 Personal history of other diseases of the respiratory system: Secondary | ICD-10-CM

## 2013-06-29 HISTORY — DX: Personal history of other diseases of the respiratory system: Z87.09

## 2015-01-02 ENCOUNTER — Other Ambulatory Visit: Payer: Self-pay | Admitting: Physical Medicine and Rehabilitation

## 2015-01-02 DIAGNOSIS — M546 Pain in thoracic spine: Secondary | ICD-10-CM

## 2015-01-08 ENCOUNTER — Other Ambulatory Visit: Payer: Self-pay | Admitting: Physical Medicine and Rehabilitation

## 2015-01-08 DIAGNOSIS — M546 Pain in thoracic spine: Secondary | ICD-10-CM

## 2015-01-18 ENCOUNTER — Other Ambulatory Visit: Payer: PRIVATE HEALTH INSURANCE

## 2015-01-21 ENCOUNTER — Other Ambulatory Visit: Payer: PRIVATE HEALTH INSURANCE

## 2015-02-04 ENCOUNTER — Institutional Professional Consult (permissible substitution): Payer: 59 | Admitting: Neurology

## 2015-02-28 ENCOUNTER — Encounter: Payer: Self-pay | Admitting: Neurology

## 2015-02-28 ENCOUNTER — Ambulatory Visit (INDEPENDENT_AMBULATORY_CARE_PROVIDER_SITE_OTHER): Payer: 59 | Admitting: Neurology

## 2015-02-28 VITALS — BP 104/68 | HR 84 | Resp 20 | Ht 69.0 in | Wt 259.0 lb

## 2015-02-28 DIAGNOSIS — F1920 Other psychoactive substance dependence, uncomplicated: Secondary | ICD-10-CM | POA: Diagnosis not present

## 2015-02-28 DIAGNOSIS — M961 Postlaminectomy syndrome, not elsewhere classified: Secondary | ICD-10-CM

## 2015-02-28 DIAGNOSIS — G894 Chronic pain syndrome: Secondary | ICD-10-CM | POA: Diagnosis not present

## 2015-02-28 DIAGNOSIS — G4730 Sleep apnea, unspecified: Secondary | ICD-10-CM

## 2015-02-28 DIAGNOSIS — F1120 Opioid dependence, uncomplicated: Secondary | ICD-10-CM

## 2015-02-28 NOTE — Patient Instructions (Signed)
Polysomnography (Sleep Studies) Polysomnography (PSG) is a series of tests used for detecting (diagnosing) obstructive sleep apnea and other sleep disorders. The tests measure how some parts of your body are working while you are sleeping. The tests are extensive and expensive. They are done in a sleep lab or hospital, and vary from center to center. Your caregiver may perform other more simple sleep studies and questionnaires before doing more complete and involved testing. Testing may not be covered by insurance. Some of these tests are:  An EEG (Electroencephalogram). This tests your brain waves and stages of sleep.  An EOG (Electrooculogram). This measures the movements of your eyes. It detects periods of REM (rapid eye movement) sleep, which is your dream sleep.  An EKG (Electrocardiogram). This measures your heart rhythm.  EMG (Electromyography). This is a measurement of how the muscles are working in your upper airway and your legs while sleeping.  An oximetry measurement. It measures how much oxygen (air) you are getting while sleeping.  Breathing efforts may be measured. The same test can be interpreted (understood) differently by different caregivers and centers that study sleep.  Studies may be given an apnea/hypopnea index (AHI). This is a number which is found by counting the times of no breathing or under breathing during the night, and relating those numbers to the amount of time spent in bed. When the AHI is greater than 15, the patient is likely to complain of daytime sleepiness. When the AHI is greater than 30, the patient is at increased risk for heart problems and must be followed more closely. Following the AHI also allows you to know how treatment is working. Simple oximetry (tracking the amount of oxygen that is taken in) can be used for screening patients who:  Do not have symptoms (problems) of OSA.  Have a normal Epworth Sleepiness Scale Score.  Have a low pre-test  probability of having OSA.  Have none of the upper airway problems likely to cause apnea.  Oximetry is also used to determine if treatment is effective in patients who showed significant desaturations (not getting enough oxygen) on their home sleep study. One extra measure of safety is to perform additional studies for the person who only snores. This is because no one can predict with absolute certainty who will have OSA. Those who show significant desaturations (not getting enough oxygen) are recommended to have a more detailed sleep study. Document Released: 12/20/2002 Document Revised: 09/07/2011 Document Reviewed: 08/21/2013 [employer] Patient Information 2015 [employer]. This information is not intended to replace advice given to you by your health care provider. Make sure you discuss any questions you have with your health care provider.  

## 2015-02-28 NOTE — Progress Notes (Signed)
SLEEP MEDICINE CLINIC   Provider:  Melvyn Novas, M D  Referring Provider: Callie Fielding, MD Primary Care Physician:  [name], MD  Chief Complaint  Patient presents with  . sleep consult    snoring, rm 10, alone   Chief complaint according to patient :" I don't sleep well  and my pain doctor is concerned about central apnea related to my pain management.  HPI:  [name] is a 49 y.o. male , seen here as a referral from Dr. Murray Hodgkins for a sleep consultation,  Mr. [name] relates that he has been treated for chronic and severe pain by Dr. Murray Hodgkins for a while over the last 2-1/2 years he was not able to maintain gainful employment and he is awaiting disability. The pain level is what has hindered him to sleep well and he had multiple surgical procedures. His past medical history is positive for spinal stenosis, asthma, hypertension, diabetes mellitus, high cholesterol, depression and anxiety. He endorsed also bilateral epicondyles tendon release surgery, forearm radial tunnel release, lower lumbar fusion cervical fusion and a release trigger surgery from the right ring finger. He reports that he has trouble falling asleep and maintaining sleep because of pain levels these are joint pains but he also feels that he has a decreased level of energy and daytime and never feels refreshed and restored when waking up in the morning. He snores this has been observed by his wife and she also has observed some apneas. He endorsed that he feels short of breath at times coughs he has often wheezing and he has swelling in his legs. He has also developed erectile dysfunction. He and his wife do not share the same bedroom because of his snoring.  Sleep habits are as follows: He goes to bed between 10 and 11 PM and it will take him about about 45 minutes to fall asleep. Once asleep he will wake up again every 1-1/2 or 2 hours. These arousals are related to pain sometimes he has the urge to  urinate at night but this is not more than once. The pain makes it often difficult for him to find a comfortable position. He rises between 5 AM and 5:30 so he is chronically sleep deprived. He is usually awake between 2 and 3 AM. His overall nocturnal sleep time does not exceed 5 hours. He describes his bedroom is cool, quiet and dark he uses light blocking shades. There is no TV in the background. No pets in the bed. His wife sleeps in a separate bedroom.  He naps in daytime, depending on his physical activity level. He tries to stay active because is also deflects and detract from his pain. A nap would last 30-45 minutes and takes place in in a recliner. His back pain is a lot better in his recliner. He may have to sleep in his recliner at night as well.  Sleep medical history and family sleep history: Mother was a sleep walker, but his father had sleep apnea.   Social history:   Review of Systems: Out of a complete 14 system review, the patient complains of only the following symptoms, and all other reviewed systems are negative. Loud snoring was witnessed apneas, nonrestorative sleep, wheezing, shortness of breath, constant pain. Fatigue severity score is 40 points and Epworth sleepiness score is only 4 points  depression score; see below.  Treated by psychiatrist,   Social History   Social History  . Marital Status: Married    Spouse  Name: N/A  . Number of Children: N/A  . Years of Education: N/A   Occupational History  . Not on file.   Social History Main Topics  . Smoking status: Former Smoker -- 20 years    Quit date: 05/20/1997  . Smokeless tobacco: Not on file  . Alcohol Use: Yes     Comment: on occasion  . Drug Use: No  . Sexual Activity: Not on file   Other Topics Concern  . Not on file   Social History Narrative   Drinks about 1-2 cups of caffeine daily.    Family History  Problem Relation Age of Onset  . Heart disease Father   . Stroke Father   . Bipolar  disorder Father   . Pneumonia Father     Past Medical History  Diagnosis Date  . Hypertension   . Reflux   . Hyperlipemia   . Diabetes mellitus     borderline- was recently started on meds  . Arthritis   . Asthma     mild  . Chronic low back pain   . Chronic neck pain   . Depression   . Sleep apnea   . Carpal tunnel syndrome     Past Surgical History  Procedure Laterality Date  . Back surgery    . Elbow surgery    . Neck surgery      fusion    Current Outpatient Prescriptions  Medication Sig Dispense Refill  . albuterol (PROVENTIL HFA;VENTOLIN HFA) 108 (90 BASE) MCG/ACT inhaler Inhale 2 puffs into the lungs every 6 (six) hours as needed.      . cyclobenzaprine (FLEXERIL) 10 MG tablet Take 10 mg by mouth 3 (three) times daily as needed for muscle spasms.    Marland Kitchen esomeprazole (NEXIUM) 40 MG capsule Take 40 mg by mouth daily before breakfast.      . FLUoxetine (PROZAC) 20 MG capsule     . gabapentin (NEURONTIN) 600 MG tablet Take 600 mg by mouth daily.    Marland Kitchen lisinopril-hydrochlorothiazide (PRINZIDE,ZESTORETIC) 20-25 MG per tablet Take 1 tablet by mouth daily.      . metFORMIN (GLUCOPHAGE) 850 MG tablet Take 850 mg by mouth 2 (two) times daily with a meal.    . oxymorphone (OPANA) 10 MG tablet Take 5 mg by mouth every 6 (six) hours as needed for pain. Take 1/2 tablet by mouth 4-6 hours when necessary pain, max 2 per day, none after 5pm     No current facility-administered medications for this visit.    Allergies as of 02/28/2015 - Review Complete 02/28/2015  Allergen Reaction Noted  . Ancef [cefazolin sodium] Shortness Of Breath and Rash 05/21/2011  . Iohexol  02/27/2010  . Sulfa drugs cross reactors Hives 05/21/2011    Vitals: BP 104/68 mmHg  Pulse 84  Resp 20  Ht 5\' 9"  (1.753 m)  Wt 259 lb (117.482 kg)  BMI 38.23 kg/m2 Last Weight:  Wt Readings from Last 1 Encounters:  02/28/15 259 lb (117.482 kg)   URK:YHCW mass index is 38.23 kg/(m^2).     Last Height:   Ht  Readings from Last 1 Encounters:  02/28/15 5\' 9"  (1.753 m)    Physical exam:  General: The patient is awake, alert and appears not in acute distress. The patient is well groomed. Head: Normocephalic, atraumatic. Neck is supple. Mallampati 4  neck circumference:18. Nasal airflow restricted , septal deviation. TMJ is evident . Retrognathia is not seen. The patient has bruxism. Cardiovascular:  Regular rate  and rhythm , without  murmurs or carotid bruit, and without distended neck veins. Respiratory: Lungs are clear to auscultation. Skin:  Without evidence of edema, or rash Trunk: BMI is elevated  The patient leans onto his cane.   Neurologic exam : The patient is awake and alert, oriented to place and time.   Memory subjective described as intact.    Attention span & concentration ability appears normal.  Speech is fluent,  without  dysarthria, dysphonia or aphasia.  Mood and affect are appropriate.  Cranial nerves: Pupils are equal and briskly reactive to light. Funduscopic exam without  evidence of pallor or edema. Extraocular movements  in vertical and horizontal planes intact and without nystagmus. Visual fields by finger perimetry are intact. Hearing to finger rub intact.   Facial sensation intact to fine touch.  Facial motor strength is symmetric and tongue and uvula move midline. Shoulder shrug was symmetrical.   Motor exam: Symmetric strength in the upper extremities but restricted grip strength on the right, dominant hand. Related to his surgery.  Sensory:  Fine touch, pinprick and vibration were tested in all extremities. Proprioception tested in the upper extremities was normal. The patient's feet are numb to all primary modalities and he has trouble feeling the pedicle when driving. He has also less strength in plantar flexion and cannot perform a toe stand.  Coordination: Rapid alternating movements in the fingers/hands was normal. Finger-to-nose maneuver  normal without  evidence of ataxia, dysmetria or tremor.  Gait and station: Patient walks with a cane assistive device and is able unassisted to climb up to the exam table. Strength within normal limits.  Stance is stable and normal.   Toe and heel stand were not possible- the patient cannot feel the forefoot and therefore is unable to stabilize gait or stand on his toes. He wears special orthopedic shoes as well. .Tandem gait is fragmented. Turns with 4  Steps. Romberg testing is positive  Deep tendon reflexes: in the  upper and lower extremities are symmetric and intact. Babinski maneuver response is  downgoing.  The patient was advised of the nature of the diagnosed sleep disorder , the treatment options and risks for general a health and wellness arising from not treating the condition.  I spent more than 45 minutes of face to face time with the patient. Greater than 50% of time was spent in counseling and coordination of care. We have discussed the diagnosis and differential and I answered the patient's questions.    At the core of this patient's problem is his chronic pain syndrome partially related to his multiple orthopedic surgeries. He also appears depressed and does acknowledge some anxiety but he sees a psychiatrist and is in treatment. He endorsed far more fatigue than excessive daytime sleepiness on his self-assessment scores. He is at high risk for sleep apnea because of an elevated BMI and large neck circumference. Deviated nasal septum also constricts his nasal air intake. He has to sleep elevated with 3 pillows in bed or sleeps in a recliner,  indicating also orthopnea / apnea. He gained 50 pounds over the last 2 years since he could not longer participate in the work force. He has been physically much left active.  Assessment:  After physical and neurologic examination, review of laboratory studies,  Personal review of imaging studies, reports of other /same  Imaging studies ,  Results of  polysomnography/ neurophysiology testing and pre-existing records as far as provided in visit., my assessment is  1) OSA high risk, BMI and Mallampati, primary pulmonary concerns related to coughing, wheezing and the feeling of shortness of breath with orthopnea. He endorses asthma and his past medical history  2) CSA high risk , opana could induce central apneas. The patient has weaned off Valium before being treated with long-acting pain medications. Unfortunately Valium addressed his muscle spasms to some degree better than what he is currently on. He does not see any chance for him to sleep at all if he wouldn't be on narcotic medication.  3)depression - treated   4) insomnia, pain related .    Plan:  Treatment plan and additional workup :  I will order an attended sleep study for this patient because I need to document if he develops hypercapnia or hypoxemia and to which sleep stages these developments occur. For this reason I would not send him for a home sleep test. In addition I hope to treat sleep apnea the same night of the diagnosis- we will order a split night study. He is to continue with antidepressant medication and not to change his pain management until the sleep study.  The patient understands that it is our goal to establish that the or palmar therapy as currently taken does not create central apneas at night that could be superimposed on obstructive sleep apnea. He may not have obstructive sleep apnea but it is essential that he has an attended sleep study to document the nature of the apnea type. In general a patient on chronic narcotic pain management is safer from central apneas sudden death if  CPAP is prescribed to treat even mild apnea. The patient had a long discussion with his pain management specialist, Dr. Murray Hodgkins. The outcome of the sleep study will influence his pain management through Dr. Murray Hodgkins as well. We will try to provide a bedroom with a wedge to that the  patient has some comfort. Should it be necessary for him to move to a recliner , will  provide one  in the bedroom as well.     Porfirio Mylar Esti Demello MD  02/28/2015   CC: Callie Fielding, Md [phone] [address] [place], [place] [address]

## 2015-05-16 ENCOUNTER — Other Ambulatory Visit: Payer: Self-pay | Admitting: Physical Medicine and Rehabilitation

## 2015-05-16 DIAGNOSIS — M5136 Other intervertebral disc degeneration, lumbar region: Secondary | ICD-10-CM

## 2015-05-21 ENCOUNTER — Ambulatory Visit
Admission: RE | Admit: 2015-05-21 | Discharge: 2015-05-21 | Disposition: A | Discharge status: Home / Self Care | Payer: PRIVATE HEALTH INSURANCE | Source: Ambulatory Visit | Attending: Physical Medicine and Rehabilitation | Admitting: Physical Medicine and Rehabilitation

## 2015-05-21 DIAGNOSIS — M5136 Other intervertebral disc degeneration, lumbar region: Secondary | ICD-10-CM

## 2015-05-21 MED ORDER — GADOBENATE DIMEGLUMINE 529 MG/ML IV SOLN
10.0000 mL | Freq: Once | INTRAVENOUS | Status: AC | PRN
  Administered 2015-05-21: 10 mL via INTRAVENOUS

## 2015-05-30 ENCOUNTER — Other Ambulatory Visit (HOSPITAL_COMMUNITY): Payer: Self-pay | Admitting: Neurosurgery

## 2015-06-04 ENCOUNTER — Other Ambulatory Visit: Payer: PRIVATE HEALTH INSURANCE

## 2015-06-20 ENCOUNTER — Encounter (HOSPITAL_COMMUNITY)
Admission: RE | Admit: 2015-06-20 | Discharge: 2015-06-20 | Disposition: A | Discharge status: Home / Self Care | Payer: 59 | Source: Ambulatory Visit | Attending: Neurosurgery | Admitting: Neurosurgery

## 2015-06-20 ENCOUNTER — Encounter (HOSPITAL_COMMUNITY): Payer: Self-pay

## 2015-06-20 DIAGNOSIS — F419 Anxiety disorder, unspecified: Secondary | ICD-10-CM | POA: Diagnosis not present

## 2015-06-20 DIAGNOSIS — Z7984 Long term (current) use of oral hypoglycemic drugs: Secondary | ICD-10-CM | POA: Diagnosis not present

## 2015-06-20 DIAGNOSIS — G4733 Obstructive sleep apnea (adult) (pediatric): Secondary | ICD-10-CM | POA: Diagnosis not present

## 2015-06-20 DIAGNOSIS — I10 Essential (primary) hypertension: Secondary | ICD-10-CM | POA: Insufficient documentation

## 2015-06-20 DIAGNOSIS — Z0183 Encounter for blood typing: Secondary | ICD-10-CM | POA: Diagnosis not present

## 2015-06-20 DIAGNOSIS — Z981 Arthrodesis status: Secondary | ICD-10-CM | POA: Diagnosis not present

## 2015-06-20 DIAGNOSIS — J45909 Unspecified asthma, uncomplicated: Secondary | ICD-10-CM | POA: Diagnosis not present

## 2015-06-20 DIAGNOSIS — Z87891 Personal history of nicotine dependence: Secondary | ICD-10-CM | POA: Insufficient documentation

## 2015-06-20 DIAGNOSIS — R9431 Abnormal electrocardiogram [ECG] [EKG]: Secondary | ICD-10-CM | POA: Diagnosis not present

## 2015-06-20 DIAGNOSIS — Z01812 Encounter for preprocedural laboratory examination: Secondary | ICD-10-CM | POA: Insufficient documentation

## 2015-06-20 DIAGNOSIS — Z79899 Other long term (current) drug therapy: Secondary | ICD-10-CM | POA: Insufficient documentation

## 2015-06-20 DIAGNOSIS — E785 Hyperlipidemia, unspecified: Secondary | ICD-10-CM | POA: Insufficient documentation

## 2015-06-20 DIAGNOSIS — K219 Gastro-esophageal reflux disease without esophagitis: Secondary | ICD-10-CM | POA: Diagnosis not present

## 2015-06-20 DIAGNOSIS — E119 Type 2 diabetes mellitus without complications: Secondary | ICD-10-CM | POA: Insufficient documentation

## 2015-06-20 DIAGNOSIS — F329 Major depressive disorder, single episode, unspecified: Secondary | ICD-10-CM | POA: Diagnosis not present

## 2015-06-20 DIAGNOSIS — Z01818 Encounter for other preprocedural examination: Secondary | ICD-10-CM | POA: Diagnosis not present

## 2015-06-20 HISTORY — DX: Reserved for inherently not codable concepts without codable children: IMO0001

## 2015-06-20 HISTORY — DX: Sleepwalking (somnambulism): F51.3

## 2015-06-20 HISTORY — DX: Other specified postprocedural states: Z98.890

## 2015-06-20 HISTORY — DX: Insomnia, unspecified: G47.00

## 2015-06-20 HISTORY — DX: Gastro-esophageal reflux disease without esophagitis: K21.9

## 2015-06-20 HISTORY — DX: Dizziness and giddiness: R42

## 2015-06-20 HISTORY — DX: Circadian rhythm sleep disorder, unspecified type: G47.20

## 2015-06-20 HISTORY — DX: Unspecified multiple injuries, initial encounter: T07.XXXA

## 2015-06-20 HISTORY — DX: Personal history of other diseases of the digestive system: Z87.19

## 2015-06-20 HISTORY — DX: Repeated falls: R29.6

## 2015-06-20 HISTORY — DX: Urgency of urination: R39.15

## 2015-06-20 HISTORY — DX: Personal history of other diseases of the respiratory system: Z87.09

## 2015-06-20 HISTORY — DX: Nocturia: R35.1

## 2015-06-20 HISTORY — DX: Nausea with vomiting, unspecified: R11.2

## 2015-06-20 HISTORY — DX: Thrombocytopenia, unspecified: D69.6

## 2015-06-20 HISTORY — DX: Anxiety disorder, unspecified: F41.9

## 2015-06-20 HISTORY — DX: Weakness: R53.1

## 2015-06-20 HISTORY — DX: Personal history of colonic polyps: Z86.010

## 2015-06-20 LAB — BASIC METABOLIC PANEL
Anion gap: 11 (ref 5–15)
BUN: 24 mg/dL — AB (ref 6–20)
CHLORIDE: 103 mmol/L (ref 101–111)
CO2: 23 mmol/L (ref 22–32)
CREATININE: 1.71 mg/dL — AB (ref 0.61–1.24)
Calcium: 9.9 mg/dL (ref 8.9–10.3)
GFR calc Af Amer: 52 mL/min — ABNORMAL LOW (ref >60)
GFR, EST NON AFRICAN AMERICAN: 45 mL/min — AB (ref >60)
GLUCOSE: 328 mg/dL — AB (ref 65–99)
Potassium: 5 mmol/L (ref 3.5–5.1)
SODIUM: 137 mmol/L (ref 135–145)

## 2015-06-20 LAB — GLUCOSE, CAPILLARY: GLUCOSE-CAPILLARY: 284 mg/dL — AB (ref 65–99)

## 2015-06-20 LAB — CBC WITH DIFFERENTIAL/PLATELET
BASOS ABS: 0 10*3/uL (ref 0.0–0.1)
Basophils Relative: 1 %
Eosinophils Absolute: 0.1 10*3/uL (ref 0.0–0.7)
Eosinophils Relative: 3 %
HEMATOCRIT: 32.5 % — AB (ref 39.0–52.0)
HEMOGLOBIN: 10.6 g/dL — AB (ref 13.0–17.0)
LYMPHS PCT: 26 %
Lymphs Abs: 1.1 10*3/uL (ref 0.7–4.0)
MCH: 35.2 pg — ABNORMAL HIGH (ref 26.0–34.0)
MCHC: 32.6 g/dL (ref 30.0–36.0)
MCV: 108 fL — AB (ref 78.0–100.0)
MONO ABS: 0.3 10*3/uL (ref 0.1–1.0)
Monocytes Relative: 8 %
NEUTROS PCT: 62 %
Neutro Abs: 2.6 10*3/uL (ref 1.7–7.7)
Platelets: 61 10*3/uL — ABNORMAL LOW (ref 150–400)
RBC: 3.01 MIL/uL — ABNORMAL LOW (ref 4.22–5.81)
RDW: 14.2 % (ref 11.5–15.5)
WBC: 4.1 10*3/uL (ref 4.0–10.5)

## 2015-06-20 LAB — TYPE AND SCREEN
ABO/RH(D): O NEG
ANTIBODY SCREEN: NEGATIVE

## 2015-06-20 LAB — SURGICAL PCR SCREEN
MRSA, PCR: NEGATIVE
STAPHYLOCOCCUS AUREUS: NEGATIVE

## 2015-06-20 NOTE — Progress Notes (Signed)
   06/20/15 0937  OBSTRUCTIVE SLEEP APNEA  Have you ever been diagnosed with sleep apnea through a sleep study? No  Do you snore loudly (loud enough to be heard through closed doors)?  1  Do you often feel tired, fatigued, or sleepy during the daytime (such as falling asleep during driving or talking to someone)? 0  Has anyone observed you stop breathing during your sleep? 0  Do you have, or are you being treated for high blood pressure? 1  BMI more than 35 kg/m2? 1  Age > 50 (1-yes) 1  Neck circumference greater than:Male 16 inches or larger, Male 17inches or larger? 1  Male Gender (Yes=1) 1  Obstructive Sleep Apnea Score 6  Score 5 or greater  Results sent to PCP

## 2015-06-20 NOTE — Pre-Procedure Instructions (Signed)
[name]  06/20/2015      [facility] [id number] [name]- [place], [name] - [address] [facility] [address] [facility] [address] [place] [address] Phone: [phone] Fax: [phone]  [facility] [place] - [address] [place] [address] Phone: [phone] Fax: [phone]    Your procedure is scheduled on Tues, Jan 3 @ 9:55 AM  [facility] Admitting at 8:45 AM  Call this number if you have problems the morning of surgery:  [phone]   Remember:  Do not eat food or drink liquids after midnight.  Take these medicines the morning of surgery with A SIP OF WATER Albuterol<Bring Your Inhaler With You>,Valium(Diazepam),Prozac(Fluoxetine),and Gabapentin(Neurontin)             Stop taking your Fish Oil and Aleve a weeks prior to surgery. No Goody's,BC's,Aspirin,Motrin,Advil,Ibuprofen,or any Herbal Medications.    Do not wear jewelry.  Do not wear lotions, powders, or colognes.  You may wear deodorant.  Men may shave face and neck.  Do not bring valuables to the hospital.  [facility] is not responsible for any belongings or valuables.  Contacts, dentures or bridgework may not be worn into surgery.  Leave your suitcase in the car.  After surgery it may be brought to your room.  For patients admitted to the hospital, discharge time will be determined by your treatment team.  Patients discharged the day of surgery will not be allowed to drive home.    Special instructions:  [place] - Preparing for Surgery  Before surgery, you can play an important role.  Because skin is not sterile, your skin needs to be as free of germs as possible.  You can reduce the number of germs on you skin by washing with CHG (chlorahexidine gluconate) soap before surgery.  CHG is an antiseptic cleaner which kills germs and bonds with the skin to continue killing germs even after  washing.  Please DO NOT use if you have an allergy to CHG or antibacterial soaps.  If your skin becomes reddened/irritated stop using the CHG and inform your nurse when you arrive at Short Stay.  Do not shave (including legs and underarms) for at least 48 hours prior to the first CHG shower.  You may shave your face.  Please follow these instructions carefully:   1.  Shower with CHG Soap the night before surgery and the                                morning of Surgery.  2.  If you choose to wash your hair, wash your hair first as usual with your       normal shampoo.  3.  After you shampoo, rinse your hair and body thoroughly to remove the                      Shampoo.  4.  Use CHG as you would any other liquid soap.  You can apply chg directly       to the skin and wash gently with scrungie or a clean washcloth.  5.  Apply the CHG Soap to your body ONLY FROM THE NECK DOWN.        Do not use on open wounds or open sores.  Avoid contact with your eyes,  ears, mouth and genitals (private parts).  Wash genitals (private parts)       with your normal soap.  6.  Wash thoroughly, paying special attention to the area where your surgery        will be performed.  7.  Thoroughly rinse your body with warm water from the neck down.  8.  DO NOT shower/wash with your normal soap after using and rinsing off       the CHG Soap.  9.  Pat yourself dry with a clean towel.            10.  Wear clean pajamas.            11.  Place clean sheets on your bed the night of your first shower and do not        sleep with pets.  Day of Surgery  Do not apply any lotions/deoderants the morning of surgery.  Please wear clean clothes to the hospital/surgery center.    Please read over the following fact sheets that you were given. Pain Booklet, Coughing and Deep Breathing, Blood Transfusion Information, MRSA Information and Surgical Site Infection Prevention

## 2015-06-20 NOTE — Progress Notes (Addendum)
Medical Md is Dr.Enrico Georgia DuffJones  Saw a cardiologist in 2008(saw d/t family hx of cardiac problems)  Echo denies ever having one  Stress test done in 2008(done d/t family hx of cardiac problems)  Heart cath in epic from 2003  EKG denies in past yr  CXR done a wk ago=to be requested from Medical Md

## 2015-06-21 ENCOUNTER — Encounter (HOSPITAL_COMMUNITY): Payer: Self-pay | Admitting: Vascular Surgery

## 2015-06-21 LAB — HEMOGLOBIN A1C
HEMOGLOBIN A1C: 8.2 % — AB (ref 4.8–5.6)
MEAN PLASMA GLUCOSE: 189 mg/dL

## 2015-06-21 NOTE — Progress Notes (Addendum)
Anesthesia Chart Review: Patient is a 49 year old male scheduled for L2-3, L3-4 PLIF on 07/02/15 by Dr. Annette Stable.  History includes former smoker, postoperative nausea vomiting, hyperlipidemia, chronic back and neck pain, obstructive sleep apnea, asthma, anxiety, GERD, hypertension, depression, diabetes mellitus type 2, sleep walking, hiatal hernia, multiple falls, thrombocytopenia, C5-6 ACDF '07, back surgery '09, tonsillectomy. BMI is consistent with morbid obesity. OSA screening score is 6.   PCP is Dr. Kristie Cowman, last visit was 06/06/15 for right lateral chest pain related to fall. CXR showed no fracture. His note also states, last labs from 1/ 22/16 showed anemia and thrombocytopenia, but did not give specifics. 2015 Abdominal U/S showed diffuse hepatic steatosis with borderline hepatomegaly and splenomegaly. There are GI(Dr. Teena Irani) notes dating back to 2005 with mention of suspicion of cirrhosis with no visible esophageal varices by EGD then.   Medications include albuterol, diazepam, Nexium, Prozac, gabapentin, Aleve, lisinopril-HCTZ, metformin, fish oil.   06/20/15 EKG: NSR, possible anteroseptal infarct (age undetermined). Low R wave in V3 is new when compared to 12/02/07 tracing in [place].  He had normal coronaries, normal LVF by 07/29/01 cardiac cath (done for CP evaluation, Dr. Cristopher Peru).  06/06/15 CXR (PCP): "Comments: Chest X-ray reveals no rib fracture, pneumothorax, infiltrate, or active disease."  Preoperative labs noted. H/H 10.6/32.5. PLT count 61K. (PLT count WNL in 2012, although patient and PCP notes indicate a known history of thrombocytopenia). LFTs were not done at PAT, and now it is too late to add. Cr 1.71, BUN 24 (no CKD history reported). Glucose 326. A1C 8.2, consistent with mean glucose of 189. T&S done.   Reviewed with anesthesiologist Dr. Lissa Hoard. Surgeon and PCP office now closed. Will follow-up after the holiday weekend to get comparison labs (Cr, CBC, and see if  recent LFT done) and have Dr. Ronnald Ramp and Dr. Annette Stable review labs for recommendations.   George Hugh [facility] Short Stay Center/Anesthesiology Phone [phone] 06/21/2015 5:52 PM  Addendum: Most recent labs received from Dr. Ronnald Ramp' office. Labs on 01/31/15 showed a Cr of 1.13, BUN 16, ALK PH 132, total bili 1.4, AST 87, ALT 75, A1C 6.2%. A1C fro 06/20/15 is now 8.2, consistent with mean glucose of 189.  No CBC received from Dr. Ronnald Ramp' office, will re-request.   Today I did touch base with Lorriane Shire at Dr. Marchelle Folks office. She will have him review tomorrow for recommendations. In the interim, she did fax a request for medical clearance to Dr. Ronnald Ramp. (On 06/21/15, I already faxed over a copy of my anesthesia note, patient's EKG, and lab results from his PAT visit to Dr. Ronnald Ramp and also a copy of his labs to Dr. Annette Stable. Both with confirmation.)   George Hugh [facility] Short Stay Center/Anesthesiology Phone [phone] 06/25/2015 5:37 PM  Addendum: Last CBC results received from Dr. Ronnald Ramp. On 07/28/14, PLT count was 93K. Other labs includes negative/non-reactive testing for Hepatitis A, B, C. Cr was 1.3. Of note, patient was seen in the ED yesterday for epistaxis. PLT count then was 83K.  George Hugh [facility] Short Stay Center/Anesthesiology Phone [phone] 06/27/2015 10:27 AM

## 2015-06-26 ENCOUNTER — Emergency Department (HOSPITAL_COMMUNITY)
Admission: EM | Admit: 2015-06-26 | Discharge: 2015-06-26 | Disposition: A | Discharge status: Home / Self Care | Payer: 59 | Attending: Physician Assistant | Admitting: Physician Assistant

## 2015-06-26 ENCOUNTER — Encounter (HOSPITAL_COMMUNITY): Payer: Self-pay

## 2015-06-26 DIAGNOSIS — F419 Anxiety disorder, unspecified: Secondary | ICD-10-CM | POA: Insufficient documentation

## 2015-06-26 DIAGNOSIS — J45909 Unspecified asthma, uncomplicated: Secondary | ICD-10-CM | POA: Insufficient documentation

## 2015-06-26 DIAGNOSIS — K219 Gastro-esophageal reflux disease without esophagitis: Secondary | ICD-10-CM | POA: Diagnosis not present

## 2015-06-26 DIAGNOSIS — Z87891 Personal history of nicotine dependence: Secondary | ICD-10-CM | POA: Diagnosis not present

## 2015-06-26 DIAGNOSIS — Z9181 History of falling: Secondary | ICD-10-CM | POA: Insufficient documentation

## 2015-06-26 DIAGNOSIS — E119 Type 2 diabetes mellitus without complications: Secondary | ICD-10-CM | POA: Diagnosis not present

## 2015-06-26 DIAGNOSIS — E785 Hyperlipidemia, unspecified: Secondary | ICD-10-CM | POA: Diagnosis not present

## 2015-06-26 DIAGNOSIS — G8929 Other chronic pain: Secondary | ICD-10-CM | POA: Diagnosis not present

## 2015-06-26 DIAGNOSIS — R040 Epistaxis: Secondary | ICD-10-CM | POA: Diagnosis present

## 2015-06-26 DIAGNOSIS — Z7984 Long term (current) use of oral hypoglycemic drugs: Secondary | ICD-10-CM | POA: Diagnosis not present

## 2015-06-26 DIAGNOSIS — D696 Thrombocytopenia, unspecified: Secondary | ICD-10-CM | POA: Diagnosis not present

## 2015-06-26 DIAGNOSIS — M1990 Unspecified osteoarthritis, unspecified site: Secondary | ICD-10-CM | POA: Insufficient documentation

## 2015-06-26 DIAGNOSIS — Z87828 Personal history of other (healed) physical injury and trauma: Secondary | ICD-10-CM | POA: Diagnosis not present

## 2015-06-26 DIAGNOSIS — Z79899 Other long term (current) drug therapy: Secondary | ICD-10-CM | POA: Diagnosis not present

## 2015-06-26 DIAGNOSIS — Z86010 Personal history of colonic polyps: Secondary | ICD-10-CM | POA: Diagnosis not present

## 2015-06-26 DIAGNOSIS — R110 Nausea: Secondary | ICD-10-CM | POA: Diagnosis not present

## 2015-06-26 DIAGNOSIS — G4700 Insomnia, unspecified: Secondary | ICD-10-CM | POA: Diagnosis not present

## 2015-06-26 DIAGNOSIS — F329 Major depressive disorder, single episode, unspecified: Secondary | ICD-10-CM | POA: Diagnosis not present

## 2015-06-26 DIAGNOSIS — Z8709 Personal history of other diseases of the respiratory system: Secondary | ICD-10-CM | POA: Diagnosis not present

## 2015-06-26 DIAGNOSIS — I10 Essential (primary) hypertension: Secondary | ICD-10-CM | POA: Insufficient documentation

## 2015-06-26 DIAGNOSIS — R42 Dizziness and giddiness: Secondary | ICD-10-CM | POA: Insufficient documentation

## 2015-06-26 LAB — CBC WITH DIFFERENTIAL/PLATELET
BASOS PCT: 1 %
Basophils Absolute: 0.1 10*3/uL (ref 0.0–0.1)
EOS PCT: 1 %
Eosinophils Absolute: 0.1 10*3/uL (ref 0.0–0.7)
HEMATOCRIT: 33.6 % — AB (ref 39.0–52.0)
HEMOGLOBIN: 11.1 g/dL — AB (ref 13.0–17.0)
Lymphocytes Relative: 16 %
Lymphs Abs: 0.9 10*3/uL (ref 0.7–4.0)
MCH: 35.1 pg — ABNORMAL HIGH (ref 26.0–34.0)
MCHC: 33 g/dL (ref 30.0–36.0)
MCV: 106.3 fL — AB (ref 78.0–100.0)
MONO ABS: 0.4 10*3/uL (ref 0.1–1.0)
MONOS PCT: 7 %
NEUTROS PCT: 75 %
Neutro Abs: 3.9 10*3/uL (ref 1.7–7.7)
Platelets: 83 10*3/uL — ABNORMAL LOW (ref 150–400)
RBC: 3.16 MIL/uL — AB (ref 4.22–5.81)
RDW: 14.3 % (ref 11.5–15.5)
WBC: 5.4 10*3/uL (ref 4.0–10.5)

## 2015-06-26 MED ORDER — ONDANSETRON 4 MG PO TBDP
4.0000 mg | ORAL_TABLET | Freq: Three times a day (TID) | ORAL | Status: AC | PRN
Start: 2015-06-26 — End: ?

## 2015-06-26 MED ORDER — ONDANSETRON 4 MG PO TBDP
4.0000 mg | ORAL_TABLET | Freq: Once | ORAL | Status: AC
  Administered 2015-06-26: 4 mg via ORAL
  Filled 2015-06-26: qty 1

## 2015-06-26 NOTE — Discharge Instructions (Signed)
Please read and follow all provided instructions.  Your diagnoses today include:  1. Epistaxis   2. Thrombocytopenia (HCC)    Tests performed today include:  Platelet count - 83  Red blood cell count (hemoglobin) - 11.1   Vital signs. See below for your results today.   Medications prescribed:   Zofran (ondansetron) - for nausea and vomiting  Home care instructions:  Read the educational materials provided and follow any instructions contained in this packet.  If your nosebleed happens again: Pinch your nose and hold for 15 minutes without letting go.  If this does not stop the bleeding, pinch and hold for another 15 minutes.  If it continues to bleed after this, please come to the Emergency Department or see your family doctor.   Follow-up instructions: Please follow-up with your primary care provider in the next 3 days for further evaluation of your symptoms.   Return instructions:   Please return to the Emergency Department if you experience worsening symptoms.   Please return if you have any other emergent concerns.  Additional Information:  Your vital signs today were: BP 127/64 mmHg   Pulse 100   Temp(Src) 98.3 F (36.8 C) (Oral)   Resp 18   Ht 5\' 8"  (1.727 m)   Wt 121.11 kg   BMI 40.61 kg/m2   SpO2 94% If your blood pressure (BP) was elevated above 135/85 this visit, please have this repeated by your doctor within one month. --------------

## 2015-06-26 NOTE — ED Notes (Signed)
PT RECEIVED VIA EMS FOR A NOSE BLEED LASTING 5 HRS. PT WENT TO HIS PMD, AND THEY ATTEMPTED A RHINOROCKET THAT MADE THE BLEEDING WORSE. PT RECEIVED AFRIN X1 SPRAY INTO THE LEFT NOSTRIL. NO ACTIVE BLEEDING, BUT PT STATES IT IS NOW RUNNING DOWN THE BACK OF HIS THROAT SLOWLY.

## 2015-06-26 NOTE — ED Notes (Signed)
PT DISCHARGED. INSTRUCTIONS AND PRESCRIPTION GIVEN. AAOX3. PT IN NO APPARENT DISTRESS OR PAIN. THE OPPORTUNITY TO ASK QUESTIONS WAS PROVIDED. 

## 2015-06-26 NOTE — ED Provider Notes (Signed)
History  By signing my name below, I, Karle PlumberJennifer Tensley, attest that this documentation has been prepared under the direction and in the presence of Josh Carver Murakami, PA-C. Electronically Signed: Karle PlumberJennifer Tensley, ED Scribe. 06/26/2015. 5:10 PM.  Chief Complaint  Patient presents with  . Epistaxis    NOSE BLEED X5 HRS   The history is provided by the patient and medical records. No language interpreter was used.    HPI Comments:  [name] is a 49 y.o. male, brought in by EMS, who presents to the Emergency Department complaining of epistaxis that began approximately five hours ago after blowing his nose. He has a history of thrombocytopenia and fatty liver disease. Pt reports he has been getting nosebleeds regularly, 2-4 per week, for the past 10 months. He has been using tampons dipped in vinegar to treat the nose bleeds. He reports associated nausea from swallowing blood and light-headedness, that have caused him to fall 3 falls in the past week, but none today. He states he was seen by his PCP and they attempted to place a [employer] but pt states that did not help. He states he received a dose of Afrin that seemed to control the bleeding. He reports blowing out several clots. He was supposed to have an appt with his ENT upcoming. He denies anticoagulant therapy or ASA. He denies vomiting or bleeding gums. He denies any previous blood transfusions.  Past Medical History  Diagnosis Date  . Hyperlipemia     takes Fish Oil daily  . Arthritis   . Chronic low back pain     stenosis  . Chronic neck pain   . Sleep apnea   . Carpal tunnel syndrome   . Asthma     Albuterol daily as needed  . Anxiety     Valium daily as needed  . GERD (gastroesophageal reflux disease)     takes Nexium daily  . Hypertension     takes Lisinopril-HCTZ daily  . Depression     takes Prozac daily  . Diabetes mellitus     takes Metformin daily  . PONV (postoperative nausea and vomiting)   . Sleep -  wake disorder   . Shortness of breath dyspnea     lying too flat  . History of bronchitis 2015  . Dizziness   . Weakness     numbness and tingling-feet  . Multiple bruises     one large one from fall on back of right upper arm and tiny ones on left upper arm  . Sleep walking   . History of hiatal hernia   . History of colon polyps     precancerous  . Urinary urgency   . Nocturia   . Platelets decreased (HCC)     per pt "always low"  . Insomnia     doesn't take any meds  . Multiple falls    Past Surgical History  Procedure Laterality Date  . Elbow surgery Bilateral   . Neck surgery  2007    fusion  . Back surgery  2009  . Left knuckle surgery      4th finger  . Wisdom teeth extracted    . Nose reset    . Tonsillectomy    . Colonoscopy    . Esophagogastroduodenoscopy     Family History  Problem Relation Age of Onset  . Heart disease Father   . Stroke Father   . Bipolar disorder Father   . Pneumonia Father  Social History  Substance Use Topics  . Smoking status: Former Smoker -- 20 years  . Smokeless tobacco: None     Comment: quit smoking 72yrs ago  . Alcohol Use: Yes     Comment: occasionally glass of wine     Review of Systems  Constitutional: Negative for fever.  HENT: Positive for nosebleeds. Negative for rhinorrhea and sore throat.   Eyes: Negative for redness.  Respiratory: Negative for cough.   Cardiovascular: Negative for chest pain.  Gastrointestinal: Positive for nausea. Negative for vomiting, abdominal pain and diarrhea.  Genitourinary: Negative for dysuria.  Musculoskeletal: Negative for myalgias.  Skin: Negative for rash.  Neurological: Positive for light-headedness. Negative for syncope and headaches.    Allergies  Ancef; Codeine; Iodine; Iohexol; and Sulfa drugs cross reactors  Home Medications   Prior to Admission medications   Medication Sig Start Date End Date Taking? Authorizing Provider  albuterol (PROVENTIL HFA;VENTOLIN HFA)  108 (90 BASE) MCG/ACT inhaler Inhale 2 puffs into the lungs every 6 (six) hours as needed.      Historical Provider, MD  diazepam (VALIUM) 10 MG tablet Take 1 tablet by mouth 3 (three) times daily as needed. 05/15/15   Historical Provider, MD  esomeprazole (NEXIUM) 20 MG capsule Take 20 mg by mouth daily at 12 noon.    Historical Provider, MD  FLUoxetine (PROZAC) 20 MG capsule Take 20 mg by mouth 3 (three) times daily.    Historical Provider, MD  gabapentin (NEURONTIN) 600 MG tablet Take 600 mg by mouth 3 (three) times daily.     Historical Provider, MD  lisinopril-hydrochlorothiazide (PRINZIDE,ZESTORETIC) 20-25 MG per tablet Take 1 tablet by mouth daily.      Historical Provider, MD  metFORMIN (GLUCOPHAGE) 850 MG tablet Take 850 mg by mouth 2 (two) times daily with a meal.    Historical Provider, MD  naproxen sodium (ALEVE) 220 MG tablet Take 440 mg by mouth 3 (three) times daily as needed.    Historical Provider, MD  Omega-3 Fatty Acids (FISH OIL) 1200 MG CAPS Take 1 capsule by mouth 2 (two) times daily.    Historical Provider, MD   Triage Vitals: BP 127/64 mmHg  Pulse 100  Temp(Src) 98.3 F (36.8 C) (Oral)  Resp 18  Ht  (1.727 m)  Wt 267 lb (121.11 kg)  BMI 40.61 kg/m2  SpO2 94% Physical Exam  Constitutional: He appears well-developed and well-nourished.  HENT:  Head: Normocephalic and atraumatic.  Right Ear: Tympanic membrane, external ear and ear canal normal.  Left Ear: Tympanic membrane, external ear and ear canal normal.  Nose: Mucosal edema present. No rhinorrhea. Epistaxis is observed.  Mouth/Throat: No oropharyngeal exudate, posterior oropharyngeal edema or posterior oropharyngeal erythema.  Clot noted to a large portion of the septal mucosa in the left nare.   Eyes: Conjunctivae and EOM are normal. Right eye exhibits no discharge. Left eye exhibits no discharge.  No conjunctival pallor.  Neck: Normal range of motion. Neck supple.  Cardiovascular: Normal rate, regular  rhythm and normal heart sounds.   Pulmonary/Chest: Effort normal and breath sounds normal.  Abdominal: Soft. There is no tenderness.  Musculoskeletal: Normal range of motion.  Neurological: He is alert.  Skin: Skin is warm and dry.  Psychiatric: He has a normal mood and affect. His behavior is normal.  Nursing note and vitals reviewed.   ED Course  Procedures (including critical care time) DIAGNOSTIC STUDIES: Oxygen Saturation is 94% on RA, adequate by my interpretation.   COORDINATION OF  CARE: 5:08 PM- Will order labs. Pt verbalizes understanding and agrees to plan.  Medications  ondansetron (ZOFRAN-ODT) disintegrating tablet 4 mg (4 mg Oral Given 06/26/15 1749)    Labs Review Labs Reviewed  CBC WITH DIFFERENTIAL/PLATELET - Abnormal; Notable for the following:    RBC 3.16 (*)    Hemoglobin 11.1 (*)    HCT 33.6 (*)    MCV 106.3 (*)    MCH 35.1 (*)    Platelets 83 (*)    All other components within normal limits    Imaging Review No results found. I have personally reviewed and evaluated these images and lab results as part of my medical decision-making.   EKG Interpretation None       Vital signs reviewed and are as follows: Filed Vitals:   06/26/15 1657  BP: 127/64  Pulse: 100  Temp: 98.3 F (36.8 C)  Resp: 18   Patient monitored for 90 minutes. Bleeding remained controlled. Updated on platelet count and hemoglobin. He will have these followed. Encouraged PCP follow-up. Return to emergency department with uncontrolled bleeding.  MDM   Final diagnoses:  Epistaxis  Thrombocytopenia (HCC)   Epistaxis: Controlled  Thrombocytopenia: Chronic, however more severe recently. Unknown etiology at this point. Encouraged follow-up with PCP to continue evaluating this.  No dangerous or life-threatening conditions suspected or identified by history, physical exam, and by work-up. No indications for hospitalization identified.    I personally performed the services  described in this documentation, which was scribed in my presence. The recorded information has been reviewed and is accurate.     Renne Crigler, PA-C 06/26/15 1829  Courteney Randall An, MD 06/26/15 2358

## 2015-07-02 ENCOUNTER — Inpatient Hospital Stay (HOSPITAL_COMMUNITY): Admission: RE | Admit: 2015-07-02 | Payer: 59 | Source: Ambulatory Visit | Admitting: Neurosurgery

## 2015-07-02 ENCOUNTER — Encounter (HOSPITAL_COMMUNITY): Admission: RE | Payer: PRIVATE HEALTH INSURANCE | Source: Ambulatory Visit

## 2015-07-02 SURGERY — POSTERIOR LUMBAR FUSION 2 LEVEL
Anesthesia: General | Site: Back

## 2015-07-03 ENCOUNTER — Telehealth: Payer: Self-pay | Admitting: Oncology

## 2015-07-03 ENCOUNTER — Encounter: Payer: Self-pay | Admitting: *Deleted

## 2015-07-03 NOTE — Telephone Encounter (Signed)
Pt confirmed appt for 07/05/15

## 2015-07-05 ENCOUNTER — Ambulatory Visit (HOSPITAL_BASED_OUTPATIENT_CLINIC_OR_DEPARTMENT_OTHER): Payer: 59 | Admitting: Oncology

## 2015-07-05 ENCOUNTER — Ambulatory Visit (HOSPITAL_BASED_OUTPATIENT_CLINIC_OR_DEPARTMENT_OTHER): Payer: PRIVATE HEALTH INSURANCE

## 2015-07-05 ENCOUNTER — Telehealth: Payer: Self-pay | Admitting: Oncology

## 2015-07-05 VITALS — BP 141/72 | HR 111 | Temp 98.1°F | Resp 20 | Ht 68.0 in | Wt 264.2 lb

## 2015-07-05 DIAGNOSIS — D689 Coagulation defect, unspecified: Secondary | ICD-10-CM

## 2015-07-05 DIAGNOSIS — D7589 Other specified diseases of blood and blood-forming organs: Secondary | ICD-10-CM

## 2015-07-05 DIAGNOSIS — D696 Thrombocytopenia, unspecified: Secondary | ICD-10-CM

## 2015-07-05 LAB — PROTIME-INR
INR: 1.2 — AB (ref 2.00–3.50)
Protime: 14.4 Seconds — ABNORMAL HIGH (ref 10.6–13.4)

## 2015-07-05 LAB — CBC WITH DIFFERENTIAL/PLATELET
BASO%: 0.5 % (ref 0.0–2.0)
Basophils Absolute: 0 10*3/uL (ref 0.0–0.1)
EOS ABS: 0.1 10*3/uL (ref 0.0–0.5)
EOS%: 0.9 % (ref 0.0–7.0)
HEMATOCRIT: 35.6 % — AB (ref 38.4–49.9)
HEMOGLOBIN: 11.9 g/dL — AB (ref 13.0–17.1)
LYMPH#: 0.9 10*3/uL (ref 0.9–3.3)
LYMPH%: 9.4 % — ABNORMAL LOW (ref 14.0–49.0)
MCH: 34.8 pg — ABNORMAL HIGH (ref 27.2–33.4)
MCHC: 33.3 g/dL (ref 32.0–36.0)
MCV: 104.3 fL — AB (ref 79.3–98.0)
MONO#: 0.8 10*3/uL (ref 0.1–0.9)
MONO%: 8.5 % (ref 0.0–14.0)
NEUT%: 80.7 % — ABNORMAL HIGH (ref 39.0–75.0)
NEUTROS ABS: 7.6 10*3/uL — AB (ref 1.5–6.5)
PLATELETS: 95 10*3/uL — AB (ref 140–400)
RBC: 3.41 10*6/uL — ABNORMAL LOW (ref 4.20–5.82)
RDW: 16.3 % — AB (ref 11.0–14.6)
WBC: 9.4 10*3/uL (ref 4.0–10.3)

## 2015-07-05 LAB — COMPREHENSIVE METABOLIC PANEL
ALBUMIN: 4 g/dL (ref 3.5–5.0)
ALK PHOS: 257 U/L — AB (ref 40–150)
ALT: 70 U/L — AB (ref 0–55)
ANION GAP: 15 meq/L — AB (ref 3–11)
AST: 100 U/L — ABNORMAL HIGH (ref 5–34)
BILIRUBIN TOTAL: 3.58 mg/dL — AB (ref 0.20–1.20)
BUN: 25.1 mg/dL (ref 7.0–26.0)
CO2: 19 meq/L — AB (ref 22–29)
CREATININE: 1.7 mg/dL — AB (ref 0.7–1.3)
Calcium: 9.8 mg/dL (ref 8.4–10.4)
Chloride: 99 mEq/L (ref 98–109)
EGFR: 44 mL/min/{1.73_m2} — AB (ref >90)
Glucose: 162 mg/dl — ABNORMAL HIGH (ref 70–140)
Potassium: 5.1 mEq/L (ref 3.5–5.1)
Sodium: 132 mEq/L — ABNORMAL LOW (ref 136–145)
TOTAL PROTEIN: 8.2 g/dL (ref 6.4–8.3)

## 2015-07-05 LAB — CHCC SMEAR

## 2015-07-05 NOTE — Progress Notes (Signed)
Please see consult note.  

## 2015-07-05 NOTE — Telephone Encounter (Signed)
Patient sent back to lab and given avs report for January.

## 2015-07-05 NOTE — Consult Note (Signed)
Reason for Referral: Thrombocytopenia.   HPI: 50 year old gentleman currently of [place] lived the majority of his life. He is a pleasant gentleman with multiple comorbid conditions include hypertension, diabetes, hyperlipidemia and obesity. He has also chronic pain syndrome related to spinal stenosis and have had spine surgery in the past. He is scheduled to have another operation on his back and part of his preoperative workup he had a CBC which noted to have thrombocytopenia with a platelet count of 61. That was done on 06/20/2015 with a white cell count of 4.1, hemoglobin of 10.6 and MCV 108. He had a normal differential at that time. His coagulations earlier at that time showed an elevated PTT of around 14 with a normal INR. He is referred to me for evaluation regarding these issues. Looking back at previous counts he did have a mild thrombocytopenia in 2011 with a count of 148. His chemistries in the past that show increase in his transaminases with AST of 49 any ALT of 70. Imaging studies have shown fatty liver as well as splenomegaly. His most recent imaging studies had an ultrasound in May 2014 which showed a spleen of around a 20 mL and diffuse steatosis. Upon entering the patient today, he does not recall any history of cirrhosis of the liver or hepatitis. He does report a heavy alcohol intake and drinks almost 4-5 days a week. He is to be a heavy drinker in the past but of cut down some. He does report easy bruisability and occasional bleeding. He did report epistaxis and currently under evaluation by ENT. He has not reported any weight loss or constitutional symptoms. He does have chronic back pain.  He does not report any headaches, blurry vision, syncope or seizures. He does not report any fevers, chills, sweats or weight loss. He does not report any chest pain, palpitation orthopnea. Does not report any cough, wheezing or hemoptysis. Does not report any nausea, vomiting or abdominal  pain. Does not report any constipation but does report stool incontinence at times. He does not report any frequency urgency or hesitancy. Does not report any lymphadenopathy or petechiae. He does report easy bruisability. Remaining review of systems unremarkable.   Past Medical History  Diagnosis Date  . Hyperlipemia     takes Fish Oil daily  . Arthritis   . Chronic low back pain     stenosis  . Chronic neck pain   . Sleep apnea   . Carpal tunnel syndrome   . Asthma     Albuterol daily as needed  . Anxiety     Valium daily as needed  . GERD (gastroesophageal reflux disease)     takes Nexium daily  . Hypertension     takes Lisinopril-HCTZ daily  . Depression     takes Prozac daily  . Diabetes mellitus     takes Metformin daily  . PONV (postoperative nausea and vomiting)   . Sleep - wake disorder   . Shortness of breath dyspnea     lying too flat  . History of bronchitis 2015  . Dizziness   . Weakness     numbness and tingling-feet  . Multiple bruises     one large one from fall on back of right upper arm and tiny ones on left upper arm  . Sleep walking   . History of hiatal hernia   . History of colon polyps     precancerous  . Urinary urgency   . Nocturia   .  Platelets decreased (HCC)     per pt "always low"  . Insomnia     doesn't take any meds  . Multiple falls   :  Past Surgical History  Procedure Laterality Date  . Elbow surgery Bilateral   . Neck surgery  2007    fusion  . Back surgery  2009  . Left knuckle surgery      4th finger  . Wisdom teeth extracted    . Nose reset    . Tonsillectomy    . Colonoscopy    . Esophagogastroduodenoscopy    :   Current outpatient prescriptions:  .  albuterol (PROVENTIL HFA;VENTOLIN HFA) 108 (90 BASE) MCG/ACT inhaler, Inhale 2 puffs into the lungs every 6 (six) hours as needed.  , Disp: , Rfl:  .  diazepam (VALIUM) 10 MG tablet, Take 1 tablet by mouth 3 (three) times daily as needed., Disp: , Rfl:  .   esomeprazole (NEXIUM) 20 MG capsule, Take 20 mg by mouth daily at 12 noon., Disp: , Rfl:  .  FLUoxetine (PROZAC) 20 MG capsule, Take 20 mg by mouth 3 (three) times daily., Disp: , Rfl:  .  gabapentin (NEURONTIN) 600 MG tablet, Take 600 mg by mouth 3 (three) times daily. , Disp: , Rfl:  .  lisinopril-hydrochlorothiazide (PRINZIDE,ZESTORETIC) 20-25 MG per tablet, Take 1 tablet by mouth daily.  , Disp: , Rfl:  .  metFORMIN (GLUCOPHAGE) 850 MG tablet, Take 850 mg by mouth 2 (two) times daily with a meal., Disp: , Rfl:  .  Multiple Vitamins-Minerals (MULTI COMPLETE PO), Take 1 tablet by mouth daily., Disp: , Rfl:  .  naproxen sodium (ALEVE) 220 MG tablet, Take 440 mg by mouth 3 (three) times daily as needed., Disp: , Rfl:  .  ondansetron (ZOFRAN ODT) 4 MG disintegrating tablet, Take 1 tablet (4 mg total) by mouth every 8 (eight) hours as needed for nausea or vomiting., Disp: 10 tablet, Rfl: 0:  Allergies  Allergen Reactions  . Ancef [Cefazolin Sodium] Shortness Of Breath and Rash    Burning sensation  . Codeine Hives and Itching  . Iodine Hives  . Iohexol      Code: HIVES, Desc: rash and hives   . Sulfa Drugs Cross Reactors Hives  :  Family History  Problem Relation Age of Onset  . Heart disease Father   . Stroke Father   . Bipolar disorder Father   . Pneumonia Father   :  Social History   Social History  . Marital Status: Married    Spouse Name: N/A  . Number of Children: N/A  . Years of Education: N/A   Occupational History  . Not on file.   Social History Main Topics  . Smoking status: Former Smoker -- 20 years  . Smokeless tobacco: Not on file     Comment: quit smoking 26yrs ago  . Alcohol Use: Yes     Comment: occasionally glass of wine   . Drug Use: Yes    Special: Marijuana     Comment: does daily for pain  . Sexual Activity: Not Currently   Other Topics Concern  . Not on file   Social History Narrative   Drinks about 1-2 cups of caffeine daily.   :  Pertinent items are noted in HPI.  Exam: Blood pressure 141/72, pulse 111, temperature 98.1 F (36.7 C), temperature source Oral, resp. rate 20, height 5\' 8"  (1.727 m), weight 264 lb 3.2 oz (119.84 kg), SpO2 96 %. ECOG  2 General appearance: alert and cooperative appeared chronically ill-appearing older than stated age. Head: Normocephalic, without obvious abnormality Nose: Nares normal. Septum midline. Mucosa normal. No drainage or sinus tenderness. Throat: lips, mucosa, and tongue normal; teeth and gums normal erythema noted without blood blisters or bleeding. Neck: no adenopathy Back: negative Resp: clear to auscultation bilaterally Chest wall: no tenderness Cardio: regular rate and rhythm, S1, S2 normal, no murmur, click, rub or gallop GI: soft, non-tender; bowel sounds normal; no masses,  no organomegaly Extremities: trace edema noted bilaterally. Pulses: 2+ and symmetric Skin: Skin color, texture, turgor normal. No rashes or lesions Lymph nodes: Cervical, supraclavicular, and axillary nodes normal.   Recent Labs  07/05/15 1151  WBC 9.4  HGB 11.9*  HCT 35.6*  PLT 95*    Peripheral smear was personally reviewed today and showed no evidence of red cell fragments or schistocytes. Polychromasia noted without any significant spherocytosis. No evidence of any dysplasia.  Assessment and Plan:    50 year old gentleman with the following issues:  1. Thrombocytopenia: The differential diagnosis was discussed today with the patient and his family. His platelet counts have ranged between 60 and 95,000 today in the setting of splenomegaly and macrocytosis. In all likelihood we are dealing with liver disease causing portal hypertension and splenomegaly causing platelet sequestration. Other etiologies such as plasma cell disorder, myelodysplasia, myeloproliferative disorder, TTP, DIC are unlikely. I doubt there is a hematological malignancy that is causing his condition. ITP could  be also a consideration.  From a management standpoint, his blood counts appear adequate at this time to withstand most surgeries with platelet count of 95. I see no need for any platelet transfusion or injections at this time.  I have recommended abstaining from alcohol given the platelet damage and liver damage that alcohol is probably causing in his case.  2. Macrocytosis: Could be related to his liver disease, B-12 and folic acid deficiency could also be contributing although the peripheral smear did not contribute to that finding. Plasma cell disorder needs to be ruled out and I'm obtaining a serum protein electrophoresis. His hemoglobin appears adequate at this time without any intervention.  3. Coagulopathy: He is at risk of postoperative bleeding given his history of alcohol use and potential liver disease. He is scheduled to have back operation in the near future which certainly can take place with the appropriate precautions. I feel that these findings were rather permanent and not fixable.  I recommend fresh frozen plasma to be given prior to his operation to ensure that his coagulation parameters are adequate given his liver disease and consideration for platelet transfusion if he develops any postoperative bleeding.  4. Follow-up: Will be in 2 weeks to discuss the results of his laboratory workup and give my final recommendation.  1.

## 2015-07-08 ENCOUNTER — Other Ambulatory Visit: Payer: Self-pay | Admitting: Oncology

## 2015-07-08 ENCOUNTER — Telehealth: Payer: Self-pay | Admitting: Oncology

## 2015-07-08 NOTE — Telephone Encounter (Signed)
Appointments made and patient is aware °

## 2015-07-09 LAB — SPEP & IFE WITH QIG
ALPHA-2-GLOBULIN: 0.7 g/dL (ref 0.5–0.9)
Albumin ELP: 4.7 g/dL (ref 3.8–4.8)
Alpha-1-Globulin: 0.4 g/dL — ABNORMAL HIGH (ref 0.2–0.3)
Beta 2: 0.4 g/dL (ref 0.2–0.5)
Beta Globulin: 0.6 g/dL (ref 0.4–0.6)
Gamma Globulin: 1.2 g/dL (ref 0.8–1.7)
IGA: 325 mg/dL (ref 68–379)
IGG (IMMUNOGLOBIN G), SERUM: 1230 mg/dL (ref 650–1600)
IGM, SERUM: 160 mg/dL (ref 41–251)
Total Protein, Serum Electrophoresis: 7.9 g/dL (ref 6.1–8.1)

## 2015-07-09 LAB — APTT: APTT: 38 s — AB (ref 24–37)

## 2015-07-18 ENCOUNTER — Ambulatory Visit (HOSPITAL_BASED_OUTPATIENT_CLINIC_OR_DEPARTMENT_OTHER): Payer: 59 | Admitting: Oncology

## 2015-07-18 ENCOUNTER — Telehealth: Payer: Self-pay | Admitting: Oncology

## 2015-07-18 VITALS — BP 104/54 | HR 106 | Temp 98.7°F | Resp 18 | Ht 68.0 in | Wt 263.0 lb

## 2015-07-18 DIAGNOSIS — K769 Liver disease, unspecified: Secondary | ICD-10-CM

## 2015-07-18 DIAGNOSIS — R161 Splenomegaly, not elsewhere classified: Secondary | ICD-10-CM

## 2015-07-18 DIAGNOSIS — R7989 Other specified abnormal findings of blood chemistry: Secondary | ICD-10-CM

## 2015-07-18 DIAGNOSIS — D696 Thrombocytopenia, unspecified: Secondary | ICD-10-CM

## 2015-07-18 DIAGNOSIS — D689 Coagulation defect, unspecified: Secondary | ICD-10-CM

## 2015-07-18 DIAGNOSIS — K709 Alcoholic liver disease, unspecified: Secondary | ICD-10-CM

## 2015-07-18 DIAGNOSIS — D7589 Other specified diseases of blood and blood-forming organs: Secondary | ICD-10-CM

## 2015-07-18 NOTE — Telephone Encounter (Signed)
Scheduled appt for pt to see Dr. Elnoria Howard on 1/23 @ 1.30pm. Msg to Tiffany to send records.

## 2015-07-18 NOTE — Progress Notes (Signed)
Hematology and Oncology Follow Up Visit  [name] [id number] 01-Jun-1966 50 y.o. 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 He has also elevation in his liver function tests.  He had symptoms of epistaxis on 2 separate occasions.   Interim History:  [name] to this today for a follow-up visit. Since the last visit, she reports feeling better overall. He is not nauseated at this time and feels less pain in his lower back. He still ambulating with the help of a walker but did not report any falls or syncope. He has not reported any other bleeding episodes or epistaxis. He denied any hematochezia or melena. He denied any hemoptysis or hematemesis. He did not any bruisability at this time.  He does not report any headaches, blurry vision, syncope or seizures. He does not report any fevers, chills, sweats or weight loss. He does not report any chest pain, palpitation orthopnea. Does not report any cough, wheezing or hemoptysis. Does not report any nausea, vomiting or abdominal pain. He does not report any frequency urgency or hesitancy. Does not report any lymphadenopathy or petechiae. Remaining review of systems unremarkable  Medications: I have reviewed the patient's current medications.  Current Outpatient Prescriptions  Medication Sig Dispense Refill  . albuterol (PROVENTIL HFA;VENTOLIN HFA) 108 (90 BASE) MCG/ACT inhaler Inhale 2 puffs into the lungs every 6 (six) hours as needed.      . diazepam (VALIUM) 10 MG tablet Take 1 tablet by mouth 3 (three) times daily as needed.    Marland Kitchen esomeprazole (NEXIUM) 20 MG capsule Take 20 mg by mouth daily at 12 noon.    Marland Kitchen FLUoxetine (PROZAC) 20 MG capsule Take 20 mg by mouth 3 (three) times daily.    Marland Kitchen gabapentin (NEURONTIN) 600 MG tablet Take 600 mg  by mouth 3 (three) times daily.     Marland Kitchen lisinopril-hydrochlorothiazide (PRINZIDE,ZESTORETIC) 20-25 MG per tablet Take 1 tablet by mouth daily.      . metFORMIN (GLUCOPHAGE) 850 MG tablet Take 850 mg by mouth 2 (two) times daily with a meal.    . Multiple Vitamins-Minerals (MULTI COMPLETE PO) Take 1 tablet by mouth daily.    . naproxen sodium (ALEVE) 220 MG tablet Take 440 mg by mouth 3 (three) times daily as needed.    . ondansetron (ZOFRAN ODT) 4 MG disintegrating tablet Take 1 tablet (4 mg total) by mouth every 8 (eight) hours as needed for nausea or vomiting. 10 tablet 0   No current facility-administered medications for this visit.     Allergies:  Allergies  Allergen Reactions  . Ancef [Cefazolin Sodium] Shortness Of Breath and Rash    Burning sensation  . Codeine Hives and Itching  . Iodine Hives  . Iohexol      Code: HIVES, Desc: rash and hives   . Sulfa Drugs Cross Reactors Hives    Past Medical History, Surgical history, Social history, and Family History were reviewed and updated.   Physical Exam: Blood pressure 104/54, pulse 106, temperature 98.7 F (37.1 C), temperature source Oral, resp. rate 18, height  (1.727 m), weight 263 lb (119.296 kg), SpO2 95 %. ECOG: 1 General appearance: alert and cooperative Head: Normocephalic, without obvious abnormality Neck: no adenopathy Lymph nodes: Cervical, supraclavicular, and axillary nodes normal. Heart:regular rate and rhythm, S1, S2 normal, no  murmur, click, rub or gallop Lung:chest clear, no wheezing, rales, normal symmetric air entry Abdomin: soft, non-tender, without masses or organomegaly EXT:no erythema, induration, or nodules   Lab Results: Lab Results  Component Value Date   WBC 9.4 07/05/2015   HGB 11.9* 07/05/2015   HCT 35.6* 07/05/2015   MCV 104.3* 07/05/2015   PLT 95* 07/05/2015     Chemistry      Component Value Date/Time   NA 132* 07/05/2015 1151   NA 137 06/20/2015 0955   K 5.1 07/05/2015 1151    K 5.0 06/20/2015 0955   CL 103 06/20/2015 0955   CO2 19* 07/05/2015 1151   CO2 23 06/20/2015 0955   BUN 25.1 07/05/2015 1151   BUN 24* 06/20/2015 0955   CREATININE 1.7* 07/05/2015 1151   CREATININE 1.71* 06/20/2015 0955      Component Value Date/Time   CALCIUM 9.8 07/05/2015 1151   CALCIUM 9.9 06/20/2015 0955   ALKPHOS 257* 07/05/2015 1151   ALKPHOS 106 05/21/2011 1752   AST 100* 07/05/2015 1151   AST 49* 05/21/2011 1752   ALT 70* 07/05/2015 1151   ALT 70* 05/21/2011 1752   BILITOT 3.58* 07/05/2015 1151   BILITOT 1.0 05/21/2011 1752      Results for [name], [name] (MRN [id number]) as of 07/18/2015 14:54  Ref. Range 07/05/2015 11:51  Albumin ELP Latest Ref Range: 3.8-4.8 g/dL 4.7  COMMENT (PROTEIN ELECTROPHOR) Unknown *  Alpha-1 Glubulin Latest Ref Range: 0.2-0.3 g/dL 0.4 (H)  Alpha-2 Globulin Latest Ref Range: 0.5-0.9 g/dL 0.7  Beta Globulin Latest Ref Range: 0.4-0.6 g/dL 0.6  Beta 2 Latest Ref Range: 0.2-0.5 g/dL 0.4  Gamma Globulin Latest Ref Range: 0.8-1.7 g/dL 1.2  SPE Interp. Unknown *  IgG (Immunoglobin G), Serum Latest Ref Range: 660-183-0461 mg/dL 9562  IgA Latest Ref Range: 68-379 mg/dL 130  IgM, Serum Latest Ref Range: 41-251 mg/dL 865  Total Protein, Serum Electrophoresis Latest Ref Range: 6.1-8.1 g/dL 7.9     Impression and Plan:    50 year old gentleman with the following issues:  1. Thrombocytopenia related due to splenomegaly and liver disease. His workup did not reveal any evidence of any hematological disorder at this time. His platelet count is adequate at this time and does not require any transfusion or growth factor support. Unless his platelet count drops below 50 no intervention is needed.  2. Abnormal liver function tests: his AST and ALT are elevated and his bilirubin is at 3.58. Last imaging studies of the abdomen done in 2014 which showed steatosis and splenomegaly. He is abstaining from alcohol at this time but has a significant drinking  history. He does also report a family history of liver disease.   Given these findings, I will refer him gastroenterology for evaluation regarding etiology of his liver disease in addition to alcohol.   3. Coagulopathy: he has elevated PT and PTT which is not surprising given his liver disease. He is at risk of rotating is reasonably high for any surgical procedure. He will require fresh frozen plasma transfusion and preparation for any surgery. He is contemplating back surgery which certainly can be done however with high risk. He prefers not to have this done at this time unless absolutely necessary which I agree.   4. Macrocytosis: his serum protein electrophoresis was reviewed today and appears normal. His macrocytosis is likely related to his liver disease.   5. Follow-up: I will be happy to see him in the future as needed.   [facility], MD 1/19/20173:08  PM

## 2015-07-19 ENCOUNTER — Telehealth: Payer: Self-pay | Admitting: Oncology

## 2015-07-19 ENCOUNTER — Ambulatory Visit: Payer: PRIVATE HEALTH INSURANCE | Admitting: Oncology

## 2015-07-19 NOTE — Telephone Encounter (Signed)
Fax records to Dr. Elnoria Howard [id number] release id

## 2015-07-25 ENCOUNTER — Encounter: Payer: Self-pay | Admitting: *Deleted

## 2015-08-16 ENCOUNTER — Encounter: Payer: Self-pay | Admitting: *Deleted

## 2015-08-16 NOTE — Progress Notes (Signed)
Note to dr Alver Fisher desk. patient requesting clearance for back surgery.

## 2015-08-23 ENCOUNTER — Encounter: Payer: Self-pay | Admitting: Oncology

## 2015-12-22 ENCOUNTER — Inpatient Hospital Stay (HOSPITAL_COMMUNITY): Payer: 59

## 2015-12-22 ENCOUNTER — Other Ambulatory Visit: Payer: Self-pay

## 2015-12-22 ENCOUNTER — Emergency Department (HOSPITAL_COMMUNITY): Payer: 59

## 2015-12-22 ENCOUNTER — Encounter (HOSPITAL_COMMUNITY): Payer: Self-pay | Admitting: *Deleted

## 2015-12-22 ENCOUNTER — Other Ambulatory Visit (HOSPITAL_COMMUNITY): Payer: Self-pay

## 2015-12-22 ENCOUNTER — Inpatient Hospital Stay (HOSPITAL_COMMUNITY)
Admission: EM | Admit: 2015-12-22 | Discharge: 2016-01-28 | DRG: 441 | Disposition: E | Payer: 59 | Attending: Internal Medicine | Admitting: Internal Medicine

## 2015-12-22 DIAGNOSIS — Z7984 Long term (current) use of oral hypoglycemic drugs: Secondary | ICD-10-CM

## 2015-12-22 DIAGNOSIS — K7689 Other specified diseases of liver: Secondary | ICD-10-CM

## 2015-12-22 DIAGNOSIS — R4182 Altered mental status, unspecified: Secondary | ICD-10-CM | POA: Diagnosis present

## 2015-12-22 DIAGNOSIS — E785 Hyperlipidemia, unspecified: Secondary | ICD-10-CM | POA: Diagnosis present

## 2015-12-22 DIAGNOSIS — I129 Hypertensive chronic kidney disease with stage 1 through stage 4 chronic kidney disease, or unspecified chronic kidney disease: Secondary | ICD-10-CM | POA: Diagnosis present

## 2015-12-22 DIAGNOSIS — F1010 Alcohol abuse, uncomplicated: Secondary | ICD-10-CM | POA: Diagnosis present

## 2015-12-22 DIAGNOSIS — J45909 Unspecified asthma, uncomplicated: Secondary | ICD-10-CM | POA: Diagnosis present

## 2015-12-22 DIAGNOSIS — F329 Major depressive disorder, single episode, unspecified: Secondary | ICD-10-CM | POA: Diagnosis present

## 2015-12-22 DIAGNOSIS — G9340 Encephalopathy, unspecified: Secondary | ICD-10-CM | POA: Insufficient documentation

## 2015-12-22 DIAGNOSIS — I781 Nevus, non-neoplastic: Secondary | ICD-10-CM | POA: Diagnosis present

## 2015-12-22 DIAGNOSIS — Z515 Encounter for palliative care: Secondary | ICD-10-CM | POA: Diagnosis not present

## 2015-12-22 DIAGNOSIS — D649 Anemia, unspecified: Secondary | ICD-10-CM | POA: Diagnosis present

## 2015-12-22 DIAGNOSIS — N183 Chronic kidney disease, stage 3 (moderate): Secondary | ICD-10-CM | POA: Diagnosis present

## 2015-12-22 DIAGNOSIS — M542 Cervicalgia: Secondary | ICD-10-CM | POA: Diagnosis present

## 2015-12-22 DIAGNOSIS — K219 Gastro-esophageal reflux disease without esophagitis: Secondary | ICD-10-CM | POA: Diagnosis present

## 2015-12-22 DIAGNOSIS — E876 Hypokalemia: Secondary | ICD-10-CM | POA: Diagnosis present

## 2015-12-22 DIAGNOSIS — Z79899 Other long term (current) drug therapy: Secondary | ICD-10-CM

## 2015-12-22 DIAGNOSIS — R161 Splenomegaly, not elsewhere classified: Secondary | ICD-10-CM | POA: Diagnosis present

## 2015-12-22 DIAGNOSIS — E1122 Type 2 diabetes mellitus with diabetic chronic kidney disease: Secondary | ICD-10-CM | POA: Diagnosis present

## 2015-12-22 DIAGNOSIS — S065X9A Traumatic subdural hemorrhage with loss of consciousness of unspecified duration, initial encounter: Secondary | ICD-10-CM | POA: Insufficient documentation

## 2015-12-22 DIAGNOSIS — K7200 Acute and subacute hepatic failure without coma: Principal | ICD-10-CM | POA: Diagnosis present

## 2015-12-22 DIAGNOSIS — D696 Thrombocytopenia, unspecified: Secondary | ICD-10-CM | POA: Diagnosis present

## 2015-12-22 DIAGNOSIS — D684 Acquired coagulation factor deficiency: Secondary | ICD-10-CM | POA: Diagnosis present

## 2015-12-22 DIAGNOSIS — R569 Unspecified convulsions: Secondary | ICD-10-CM | POA: Insufficient documentation

## 2015-12-22 DIAGNOSIS — N179 Acute kidney failure, unspecified: Secondary | ICD-10-CM | POA: Diagnosis present

## 2015-12-22 DIAGNOSIS — R188 Other ascites: Secondary | ICD-10-CM | POA: Diagnosis present

## 2015-12-22 DIAGNOSIS — R748 Abnormal levels of other serum enzymes: Secondary | ICD-10-CM | POA: Diagnosis present

## 2015-12-22 DIAGNOSIS — Z66 Do not resuscitate: Secondary | ICD-10-CM | POA: Diagnosis present

## 2015-12-22 DIAGNOSIS — K7290 Hepatic failure, unspecified without coma: Secondary | ICD-10-CM | POA: Diagnosis not present

## 2015-12-22 DIAGNOSIS — Z221 Carrier of other intestinal infectious diseases: Secondary | ICD-10-CM

## 2015-12-22 DIAGNOSIS — Z7189 Other specified counseling: Secondary | ICD-10-CM | POA: Insufficient documentation

## 2015-12-22 DIAGNOSIS — Z87891 Personal history of nicotine dependence: Secondary | ICD-10-CM | POA: Diagnosis not present

## 2015-12-22 DIAGNOSIS — E872 Acidosis: Secondary | ICD-10-CM | POA: Diagnosis not present

## 2015-12-22 DIAGNOSIS — K7460 Unspecified cirrhosis of liver: Secondary | ICD-10-CM | POA: Diagnosis present

## 2015-12-22 DIAGNOSIS — K700 Alcoholic fatty liver: Secondary | ICD-10-CM | POA: Diagnosis present

## 2015-12-22 DIAGNOSIS — G4730 Sleep apnea, unspecified: Secondary | ICD-10-CM | POA: Diagnosis present

## 2015-12-22 DIAGNOSIS — E860 Dehydration: Secondary | ICD-10-CM | POA: Diagnosis present

## 2015-12-22 DIAGNOSIS — F419 Anxiety disorder, unspecified: Secondary | ICD-10-CM | POA: Diagnosis present

## 2015-12-22 DIAGNOSIS — R278 Other lack of coordination: Secondary | ICD-10-CM | POA: Diagnosis present

## 2015-12-22 DIAGNOSIS — Z91041 Radiographic dye allergy status: Secondary | ICD-10-CM

## 2015-12-22 DIAGNOSIS — I619 Nontraumatic intracerebral hemorrhage, unspecified: Secondary | ICD-10-CM | POA: Diagnosis not present

## 2015-12-22 DIAGNOSIS — R945 Abnormal results of liver function studies: Secondary | ICD-10-CM | POA: Diagnosis present

## 2015-12-22 DIAGNOSIS — M545 Low back pain: Secondary | ICD-10-CM | POA: Diagnosis present

## 2015-12-22 DIAGNOSIS — I6200 Nontraumatic subdural hemorrhage, unspecified: Secondary | ICD-10-CM | POA: Diagnosis not present

## 2015-12-22 DIAGNOSIS — R9401 Abnormal electroencephalogram [EEG]: Secondary | ICD-10-CM | POA: Diagnosis not present

## 2015-12-22 DIAGNOSIS — Z881 Allergy status to other antibiotic agents status: Secondary | ICD-10-CM

## 2015-12-22 DIAGNOSIS — R0602 Shortness of breath: Secondary | ICD-10-CM

## 2015-12-22 DIAGNOSIS — J9600 Acute respiratory failure, unspecified whether with hypoxia or hypercapnia: Secondary | ICD-10-CM | POA: Diagnosis not present

## 2015-12-22 DIAGNOSIS — G8929 Other chronic pain: Secondary | ICD-10-CM | POA: Diagnosis present

## 2015-12-22 DIAGNOSIS — E871 Hypo-osmolality and hyponatremia: Secondary | ICD-10-CM | POA: Diagnosis present

## 2015-12-22 DIAGNOSIS — J9601 Acute respiratory failure with hypoxia: Secondary | ICD-10-CM

## 2015-12-22 DIAGNOSIS — R441 Visual hallucinations: Secondary | ICD-10-CM | POA: Diagnosis not present

## 2015-12-22 DIAGNOSIS — I6201 Nontraumatic acute subdural hemorrhage: Secondary | ICD-10-CM | POA: Diagnosis not present

## 2015-12-22 DIAGNOSIS — R932 Abnormal findings on diagnostic imaging of liver and biliary tract: Secondary | ICD-10-CM

## 2015-12-22 DIAGNOSIS — S065XAA Traumatic subdural hemorrhage with loss of consciousness status unknown, initial encounter: Secondary | ICD-10-CM | POA: Insufficient documentation

## 2015-12-22 DIAGNOSIS — Z885 Allergy status to narcotic agent status: Secondary | ICD-10-CM

## 2015-12-22 DIAGNOSIS — E8881 Metabolic syndrome: Secondary | ICD-10-CM | POA: Diagnosis present

## 2015-12-22 DIAGNOSIS — M79606 Pain in leg, unspecified: Secondary | ICD-10-CM | POA: Diagnosis not present

## 2015-12-22 DIAGNOSIS — Z882 Allergy status to sulfonamides status: Secondary | ICD-10-CM

## 2015-12-22 DIAGNOSIS — K7682 Hepatic encephalopathy: Secondary | ICD-10-CM

## 2015-12-22 DIAGNOSIS — R270 Ataxia, unspecified: Secondary | ICD-10-CM | POA: Diagnosis present

## 2015-12-22 DIAGNOSIS — Z981 Arthrodesis status: Secondary | ICD-10-CM

## 2015-12-22 DIAGNOSIS — F1290 Cannabis use, unspecified, uncomplicated: Secondary | ICD-10-CM | POA: Diagnosis present

## 2015-12-22 HISTORY — DX: Fatty (change of) liver, not elsewhere classified: K76.0

## 2015-12-22 LAB — COMPREHENSIVE METABOLIC PANEL
ALBUMIN: 2.3 g/dL — AB (ref 3.5–5.0)
ALK PHOS: 175 U/L — AB (ref 38–126)
ALT: 62 U/L (ref 17–63)
AST: 164 U/L — AB (ref 15–41)
Anion gap: 13 (ref 5–15)
BUN: 37 mg/dL — AB (ref 6–20)
CHLORIDE: 100 mmol/L — AB (ref 101–111)
CO2: 19 mmol/L — AB (ref 22–32)
CREATININE: 2.93 mg/dL — AB (ref 0.61–1.24)
Calcium: 9.4 mg/dL (ref 8.9–10.3)
GFR calc non Af Amer: 23 mL/min — ABNORMAL LOW (ref >60)
GFR, EST AFRICAN AMERICAN: 27 mL/min — AB (ref >60)
GLUCOSE: 108 mg/dL — AB (ref 65–99)
Potassium: 2.2 mmol/L — CL (ref 3.5–5.1)
SODIUM: 132 mmol/L — AB (ref 135–145)
Total Bilirubin: 46.9 mg/dL (ref 0.3–1.2)
Total Protein: 6.4 g/dL — ABNORMAL LOW (ref 6.5–8.1)

## 2015-12-22 LAB — SODIUM, URINE, RANDOM: Sodium, Ur: 30 mmol/L

## 2015-12-22 LAB — URINALYSIS, ROUTINE W REFLEX MICROSCOPIC
GLUCOSE, UA: NEGATIVE mg/dL
HGB URINE DIPSTICK: NEGATIVE
Ketones, ur: 15 mg/dL — AB
Nitrite: NEGATIVE
PROTEIN: NEGATIVE mg/dL
Specific Gravity, Urine: 1.016 (ref 1.005–1.030)
pH: 6 (ref 5.0–8.0)

## 2015-12-22 LAB — URINE MICROSCOPIC-ADD ON: RBC / HPF: NONE SEEN RBC/hpf (ref 0–5)

## 2015-12-22 LAB — I-STAT CG4 LACTIC ACID, ED
LACTIC ACID, VENOUS: 1.2 mmol/L (ref 0.5–2.0)
LACTIC ACID, VENOUS: 0.86 mmol/L (ref 0.5–2.0)

## 2015-12-22 LAB — AMMONIA: AMMONIA: 64 umol/L — AB (ref 9–35)

## 2015-12-22 LAB — PROTIME-INR
INR: 1.83 — ABNORMAL HIGH (ref 0.00–1.49)
PROTHROMBIN TIME: 21.1 s — AB (ref 11.6–15.2)

## 2015-12-22 LAB — CBC
HCT: 28.6 % — ABNORMAL LOW (ref 39.0–52.0)
Hemoglobin: 10.1 g/dL — ABNORMAL LOW (ref 13.0–17.0)
MCH: 36.6 pg — AB (ref 26.0–34.0)
MCHC: 35.3 g/dL (ref 30.0–36.0)
MCV: 103.6 fL — AB (ref 78.0–100.0)
PLATELETS: 77 10*3/uL — AB (ref 150–400)
RBC: 2.76 MIL/uL — AB (ref 4.22–5.81)
RDW: 15.6 % — ABNORMAL HIGH (ref 11.5–15.5)
WBC: 5.4 10*3/uL (ref 4.0–10.5)

## 2015-12-22 LAB — ETHANOL

## 2015-12-22 LAB — TYPE AND SCREEN
ABO/RH(D): O NEG
ANTIBODY SCREEN: NEGATIVE

## 2015-12-22 LAB — LIPASE, BLOOD: LIPASE: 68 U/L — AB (ref 11–51)

## 2015-12-22 LAB — TROPONIN I: Troponin I: <0.03 ng/mL (ref <0.031)

## 2015-12-22 MED ORDER — THIAMINE HCL 100 MG/ML IJ SOLN
100.0000 mg | Freq: Once | INTRAMUSCULAR | Status: AC
  Administered 2015-12-22: 100 mg via INTRAVENOUS
  Filled 2015-12-22: qty 2

## 2015-12-22 MED ORDER — POTASSIUM CHLORIDE 10 MEQ/100ML IV SOLN
10.0000 meq | INTRAVENOUS | Status: AC
  Administered 2015-12-22 (×2): 10 meq via INTRAVENOUS
  Filled 2015-12-22 (×2): qty 100

## 2015-12-22 MED ORDER — INSULIN ASPART 100 UNIT/ML ~~LOC~~ SOLN
0.0000 [IU] | Freq: Three times a day (TID) | SUBCUTANEOUS | Status: DC
  Administered 2015-12-24 – 2015-12-25 (×4): 1 [IU] via SUBCUTANEOUS

## 2015-12-22 MED ORDER — FOLIC ACID 1 MG PO TABS
1.0000 mg | ORAL_TABLET | Freq: Once | ORAL | Status: AC
  Administered 2015-12-22: 1 mg via ORAL
  Filled 2015-12-22: qty 1

## 2015-12-22 MED ORDER — MAGNESIUM SULFATE 2 GM/50ML IV SOLN
2.0000 g | INTRAVENOUS | Status: AC
  Administered 2015-12-22: 2 g via INTRAVENOUS
  Filled 2015-12-22: qty 50

## 2015-12-22 MED ORDER — FLUOXETINE HCL 20 MG PO CAPS
20.0000 mg | ORAL_CAPSULE | Freq: Every day | ORAL | Status: DC
  Administered 2015-12-23 – 2015-12-25 (×3): 20 mg via ORAL
  Filled 2015-12-22 (×3): qty 1

## 2015-12-22 MED ORDER — PANTOPRAZOLE SODIUM 40 MG PO TBEC
40.0000 mg | DELAYED_RELEASE_TABLET | Freq: Two times a day (BID) | ORAL | Status: DC
  Administered 2015-12-22 – 2015-12-25 (×7): 40 mg via ORAL
  Filled 2015-12-22 (×7): qty 1

## 2015-12-22 MED ORDER — POTASSIUM CHLORIDE 10 MEQ/100ML IV SOLN
10.0000 meq | Freq: Once | INTRAVENOUS | Status: AC
  Administered 2015-12-22: 10 meq via INTRAVENOUS
  Filled 2015-12-22: qty 100

## 2015-12-22 MED ORDER — SODIUM CHLORIDE 0.9% FLUSH
3.0000 mL | Freq: Two times a day (BID) | INTRAVENOUS | Status: DC
  Administered 2015-12-23 – 2015-12-29 (×6): 3 mL via INTRAVENOUS

## 2015-12-22 MED ORDER — POTASSIUM CHLORIDE 10 MEQ/100ML IV SOLN
10.0000 meq | Freq: Once | INTRAVENOUS | Status: AC
  Administered 2015-12-22: 10 meq via INTRAVENOUS

## 2015-12-22 MED ORDER — ONDANSETRON HCL 4 MG/2ML IJ SOLN: 4.0000 mg | Freq: Three times a day (TID) | INTRAMUSCULAR | Status: AC | PRN

## 2015-12-22 MED ORDER — POTASSIUM CHLORIDE 10 MEQ/100ML IV SOLN
INTRAVENOUS | Status: AC
  Administered 2015-12-22: 10 meq via INTRAVENOUS
  Filled 2015-12-22: qty 100

## 2015-12-22 MED ORDER — LACTULOSE 10 GM/15ML PO SOLN
30.0000 g | Freq: Two times a day (BID) | ORAL | Status: DC
  Administered 2015-12-23 (×2): 30 g via ORAL
  Filled 2015-12-22 (×2): qty 45

## 2015-12-22 MED ORDER — SODIUM CHLORIDE 0.9 % IV BOLUS (SEPSIS)
1000.0000 mL | Freq: Once | INTRAVENOUS | Status: AC
  Administered 2015-12-22: 1000 mL via INTRAVENOUS

## 2015-12-22 MED ORDER — SODIUM CHLORIDE 0.9 % IV BOLUS (SEPSIS)
1000.0000 mL | Freq: Once | INTRAVENOUS | Status: AC
Start: 2015-12-22 — End: 2015-12-22
  Administered 2015-12-22: 1000 mL via INTRAVENOUS

## 2015-12-22 MED ORDER — ONDANSETRON 4 MG PO TBDP
4.0000 mg | ORAL_TABLET | Freq: Three times a day (TID) | ORAL | Status: DC | PRN
  Filled 2015-12-22: qty 1

## 2015-12-22 MED ORDER — SODIUM CHLORIDE 0.9 % IV SOLN
INTRAVENOUS | Status: AC
  Administered 2015-12-22: 1000 mL via INTRAVENOUS

## 2015-12-22 NOTE — ED Provider Notes (Signed)
CSN: [id number]     Arrival date & time 12/23/2015  1659 History   First MD Initiated Contact with Patient 12/24/2015 1825     Chief Complaint  Patient presents with  . Dehydration  . Jaundice     (Consider location/radiation/quality/duration/timing/severity/associated sxs/prior Treatment) HPI  The patient is a 50 year old male, he does have a history of fatty liver disease and was told in January by the gastroenterologist at that time that he needed endoscopy, the patient and his family member have not pursued this because of cost and his uninsured status but states that over the last month he has had a fairly progressive and rapid decline in both his mental status, his ability to walk in that he is completely off balance, in his ability to function and in a severe jaundice. He is nauseated most of the time, the tenderness very little other than alcohol which she still partakes. He denies any prior abdominal surgery. He has had problems with unexplained epistaxis and known thrombocytopenia which is likely related to his liver disease. The symptoms of been persistent, gradually worsening and have now become severe. His wife states that he has been jaundiced for over 1 month but it continues to get worse.  Past Medical History  Diagnosis Date  . Hyperlipemia     takes Fish Oil daily  . Arthritis   . Chronic low back pain     stenosis  . Chronic neck pain   . Sleep apnea   . Carpal tunnel syndrome   . Asthma     Albuterol daily as needed  . Anxiety     Valium daily as needed  . GERD (gastroesophageal reflux disease)     takes Nexium daily  . Hypertension     takes Lisinopril-HCTZ daily  . Depression     takes Prozac daily  . Diabetes mellitus     takes Metformin daily  . PONV (postoperative nausea and vomiting)   . Sleep - wake disorder   . Shortness of breath dyspnea     lying too flat  . History of bronchitis 2015  . Dizziness   . Weakness     numbness and tingling-feet  .  Multiple bruises     one large one from fall on back of right upper arm and tiny ones on left upper arm  . Sleep walking   . History of hiatal hernia   . History of colon polyps     precancerous  . Urinary urgency   . Nocturia   . Platelets decreased (HCC)     per pt "always low"  . Insomnia     doesn't take any meds  . Multiple falls   . Fatty liver disease, nonalcoholic    Past Surgical History  Procedure Laterality Date  . Elbow surgery Bilateral   . Neck surgery  2007    fusion  . Back surgery  2009  . Left knuckle surgery      4th finger  . Wisdom teeth extracted    . Nose reset    . Tonsillectomy    . Colonoscopy    . Esophagogastroduodenoscopy     Family History  Problem Relation Age of Onset  . Heart disease Father   . Stroke Father   . Bipolar disorder Father   . Pneumonia Father    Social History  Substance Use Topics  . Smoking status: Former Smoker -- 20 years  . Smokeless tobacco: None     Comment:  quit smoking 6653yrs ago  . Alcohol Use: Yes     Comment: occasionally glass of wine     Review of Systems  All other systems reviewed and are negative.     Allergies  Ancef; Codeine; Iodine; Iohexol; and Sulfa drugs cross reactors  Home Medications   Prior to Admission medications   Medication Sig Start Date End Date Taking? Authorizing Provider  albuterol (PROVENTIL HFA;VENTOLIN HFA) 108 (90 BASE) MCG/ACT inhaler Inhale 2 puffs into the lungs every 6 (six) hours as needed.      Historical Provider, MD  diazepam (VALIUM) 10 MG tablet Take 1 tablet by mouth 3 (three) times daily as needed. 05/15/15   Historical Provider, MD  esomeprazole (NEXIUM) 20 MG capsule Take 20 mg by mouth daily at 12 noon.    Historical Provider, MD  FLUoxetine (PROZAC) 20 MG capsule Take 20 mg by mouth 3 (three) times daily.    Historical Provider, MD  gabapentin (NEURONTIN) 600 MG tablet Take 600 mg by mouth 3 (three) times daily.     Historical Provider, MD   lisinopril-hydrochlorothiazide (PRINZIDE,ZESTORETIC) 20-25 MG per tablet Take 1 tablet by mouth daily.      Historical Provider, MD  metFORMIN (GLUCOPHAGE) 850 MG tablet Take 850 mg by mouth 2 (two) times daily with a meal.    Historical Provider, MD  Multiple Vitamins-Minerals (MULTI COMPLETE PO) Take 1 tablet by mouth daily.    Historical Provider, MD  naproxen sodium (ALEVE) 220 MG tablet Take 440 mg by mouth 3 (three) times daily as needed.    Historical Provider, MD  ondansetron (ZOFRAN ODT) 4 MG disintegrating tablet Take 1 tablet (4 mg total) by mouth every 8 (eight) hours as needed for nausea or vomiting. 06/26/15   Renne CriglerJoshua Geiple, PA-C   BP 105/66 mmHg  Pulse 76  Temp(Src) 98.3 F (36.8 C) (Oral)  Resp 18  SpO2 100% Physical Exam  Constitutional: He appears well-developed and well-nourished. He appears distressed.  HENT:  Head: Normocephalic and atraumatic.  Mouth/Throat: No oropharyngeal exudate.  Dry mucous membranes  Eyes: EOM are normal. Pupils are equal, round, and reactive to light. Right eye exhibits no discharge. Left eye exhibits no discharge. Scleral icterus is present.  Jaundice and scleral icterus present  Neck: Normal range of motion. Neck supple. No JVD present. No thyromegaly present.  Cardiovascular: Normal rate, regular rhythm, normal heart sounds and intact distal pulses.  Exam reveals no gallop and no friction rub.   No murmur heard. Pulmonary/Chest: Effort normal and breath sounds normal. No respiratory distress. He has no wheezes. He has no rales.  Abdominal: Soft. Bowel sounds are normal. He exhibits no distension and no mass. There is no tenderness.  Musculoskeletal: Normal range of motion. He exhibits no edema or tenderness.  Diffuse generalized weakness  Lymphadenopathy:    He has no cervical adenopathy.  Neurological: He is alert. Coordination normal.  The patient is slow to respond but answers appropriately. His responses are slow in their delivery,  he is not slurring his speech, he is able to follow commands but has diffuse weakness. He has slight asterixis bilaterally. He is barely able to lift his legs off the bed.  Skin: Skin is warm and dry. No rash noted. No erythema.  Psychiatric: He has a normal mood and affect. His behavior is normal.  Nursing note and vitals reviewed.   ED Course  Procedures (including critical care time) Labs Review Labs Reviewed  LIPASE, BLOOD - Abnormal; Notable for the  following:    Lipase 68 (*)    All other components within normal limits  COMPREHENSIVE METABOLIC PANEL - Abnormal; Notable for the following:    Sodium 132 (*)    Potassium 2.2 (*)    Chloride 100 (*)    CO2 19 (*)    Glucose, Bld 108 (*)    BUN 37 (*)    Creatinine, Ser 2.93 (*)    Total Protein 6.4 (*)    Albumin 2.3 (*)    AST 164 (*)    Alkaline Phosphatase 175 (*)    Total Bilirubin 46.9 (*)    GFR calc non Af Amer 23 (*)    GFR calc Af Amer 27 (*)    All other components within normal limits  CBC - Abnormal; Notable for the following:    RBC 2.76 (*)    Hemoglobin 10.1 (*)    HCT 28.6 (*)    MCV 103.6 (*)    MCH 36.6 (*)    RDW 15.6 (*)    Platelets 77 (*)    All other components within normal limits  PROTIME-INR - Abnormal; Notable for the following:    Prothrombin Time 21.1 (*)    INR 1.83 (*)    All other components within normal limits  URINALYSIS, ROUTINE W REFLEX MICROSCOPIC (NOT [facility])  AMMONIA  I-STAT CG4 LACTIC ACID, ED  I-STAT CG4 LACTIC ACID, ED    Imaging Review No results found. I have personally reviewed and evaluated these images and lab results as part of my medical decision-making.   EKG Interpretation None      MDM   Final diagnoses:  Acute liver failure  Acute renal failure, unspecified acute renal failure type (HCC)  Hypokalemia  Acute encephalopathy    The patient appears critically ill with severe liver dysfunction and liver failure. He has a bilirubin of 46.9, acute  renal failure with a creatinine closed with 3, anemia and is coagulopathic likely from liver failure. He has a thrombus that opinion and a severe hypokalemia. He will need IV fluids, replacement of magnesium, potassium, folate, thiamine, fluids and admission to the hospital. I will obtain a right upper quadrant ultrasound though I think the most of his symptoms are likely related to his progressive liver dysfunction from both fatty liver and alcohol abuse.  The pt does not have any CP or SOB, he has abnormal troponin and ECG - I paged cardiology immediately upon ECG receipt.    D/w Cards Dr. Tresa EndoKelly at 7 PM - agrees not to activate as the pt has multisystem organ failure and needs stabilizing therapy.  D/w GI - will see in consultation D/w \\Hosp ;italist who will admit Pt is critically ill with multi organ system failure  CRITICAL CARE Performed by: Vida RollerBrian D Yang Rack Total critical care time: 35 minutes Critical care time was exclusive of separately billable procedures and treating other patients. Critical care was necessary to treat or prevent imminent or life-threatening deterioration. Critical care was time spent personally by me on the following activities: development of treatment plan with patient and/or surrogate as well as nursing, discussions with consultants, evaluation of patient's response to treatment, examination of patient, obtaining history from patient or surrogate, ordering and performing treatments and interventions, ordering and review of laboratory studies, ordering and review of radiographic studies, pulse oximetry and re-evaluation of patient's condition.    Eber HongBrian Clair Alfieri, MD 12/23/15 [phone]

## 2015-12-22 NOTE — ED Notes (Signed)
Report attempted 

## 2015-12-22 NOTE — ED Notes (Signed)
Severe jaundice noted at triage. Pt and family reports jaundice x 1 month along with confusion. Has hx of fatty liver disease that he sees dr hung for. Pt having decrease in po intake, reports fatigue, reports urinating blood x 2 months. Rash noted on arms.

## 2015-12-22 NOTE — H&P (Addendum)
TRH H&P   Patient Demographics:    [name], is a 50 y.o. male  MRN: [id number]   DOB - August 03, 1965  Admit Date - 01-10-2016  Outpatient Primary MD for the patient is [name], MD  Referring MD/NP/PA:  Eber Hong  Outpatient Specialists: Jeani Hawking (GI)  Patient coming from: home  Chief Complaint  Patient presents with  . Dehydration  . Jaundice      HPI:    [name]  is a 50 y.o. male, Alcoholic Fatty liver disease, Dm2, anemia, Thrombocytopenia, apparently  Was more confused and weak and therefore presented to the ED for evaluation.    In ED,  Developed epigastric discomfort while in ED. + emesis, no blood (x 3months),  + diarrhea, "loose stool" 2-3 months.  Pt states that his symptoms have been getting worse. + black stool.  Denies fever, chills, brbpr.     Review of systems:    In addition to the HPI above,  No Fever-chills, No Headache, No changes with Vision or hearing, No problems swallowing food or Liquids, No Chest pain, Cough or Shortness of Breath, No Blood in stool or Urine, No dysuria, No new skin rashes or bruises, No new joints pains-aches,  No new weakness, tingling, numbness in any extremity, No recent weight gain or loss, No polyuria, polydypsia or polyphagia, No significant Mental Stressors.  A full 10 point Review of Systems was done, except as stated above, all other Review of Systems were negative.   With Past History of the following :    Past Medical History  Diagnosis Date  . Hyperlipemia     takes Fish Oil daily  . Arthritis   . Chronic low back pain     stenosis  . Chronic neck pain   . Sleep apnea   . Carpal tunnel syndrome   . Asthma     Albuterol daily as needed  . Anxiety     Valium daily as needed  . GERD (gastroesophageal reflux disease)     takes Nexium daily  . Hypertension    takes Lisinopril-HCTZ daily  . Depression     takes Prozac daily  . Diabetes mellitus     takes Metformin daily  . PONV (postoperative nausea and vomiting)   . Sleep - wake disorder   . Shortness of breath dyspnea     lying too flat  . History of bronchitis 2015  . Dizziness   . Weakness     numbness and tingling-feet  . Multiple bruises     one large one from fall on back of right upper arm and tiny ones on left upper arm  . Sleep walking   . History of hiatal hernia   . History of colon polyps     precancerous  . Urinary urgency   . Nocturia   . Platelets decreased (HCC)  per pt "always low"  . Insomnia     doesn't take any meds  . Multiple falls   . Fatty liver disease, nonalcoholic       Past Surgical History  Procedure Laterality Date  . Elbow surgery Bilateral   . Neck surgery  2007    fusion  . Back surgery  2009  . Left knuckle surgery      4th finger  . Wisdom teeth extracted    . Nose reset    . Tonsillectomy    . Colonoscopy    . Esophagogastroduodenoscopy        Social History:     Social History  Substance Use Topics  . Smoking status: Former Smoker -- 20 years  . Smokeless tobacco: Not on file     Comment: quit smoking 3741yrs ago  . Alcohol Use: Yes     Comment: occasionally glass of wine      Lives -  At home  Mobility - ambulates typically without assistance   Family History :     Family History  Problem Relation Age of Onset  . Heart disease Father   . Stroke Father   . Bipolar disorder Father   . Pneumonia Father   . Liver disease Brother     Fatty liver      Home Medications:   Prior to Admission medications   Medication Sig Start Date End Date Taking? Authorizing Provider  albuterol (PROVENTIL HFA;VENTOLIN HFA) 108 (90 BASE) MCG/ACT inhaler Inhale 2 puffs into the lungs every 6 (six) hours as needed.     Yes Historical Provider, MD  diazepam (VALIUM) 10 MG tablet Take 1 tablet by mouth 3 (three) times daily as  needed. 05/15/15  Yes Historical Provider, MD  esomeprazole (NEXIUM) 20 MG capsule Take 20 mg by mouth daily at 12 noon.   Yes Historical Provider, MD  FLUoxetine (PROZAC) 20 MG capsule Take 20 mg by mouth daily.    Yes Historical Provider, MD  gabapentin (NEURONTIN) 600 MG tablet Take 600 mg by mouth 3 (three) times daily.    Yes Historical Provider, MD  lisinopril-hydrochlorothiazide (PRINZIDE,ZESTORETIC) 20-25 MG per tablet Take 1 tablet by mouth daily.     Yes Historical Provider, MD  metFORMIN (GLUCOPHAGE) 850 MG tablet Take 850 mg by mouth 2 (two) times daily with a meal.   Yes Historical Provider, MD  Multiple Vitamins-Minerals (MULTI COMPLETE PO) Take 1 tablet by mouth daily.   Yes Historical Provider, MD  naproxen sodium (ALEVE) 220 MG tablet Take 440 mg by mouth 3 (three) times daily as needed.   Yes Historical Provider, MD  ondansetron (ZOFRAN ODT) 4 MG disintegrating tablet Take 1 tablet (4 mg total) by mouth every 8 (eight) hours as needed for nausea or vomiting. 06/26/15  Yes Renne CriglerJoshua Geiple, PA-C     Allergies:     Allergies  Allergen Reactions  . Ancef [Cefazolin Sodium] Shortness Of Breath and Rash    Burning sensation  . Codeine Hives and Itching  . Iodine Hives  . Iohexol      Code: HIVES, Desc: rash and hives   . Sulfa Drugs Cross Reactors Hives     Physical Exam:   Vitals  Blood pressure 105/66, pulse 76, temperature 98.3 F (36.8 C), temperature source Oral, resp. rate 18, SpO2 100 %.   1. General  lying in bed in NAD,    2. Normal affect and insight, Not Suicidal or Homicidal, Awake Alert, Oriented X 3.  3. No F.N deficits, ALL C.Nerves Intact, Strength 5/5 all 4 extremities, Sensation intact all 4 extremities, Plantars down going.  4. Ears appear Normal, Eyes:  + jaundice,  icterus Conjunctivae clear, PERRLA. Moist Oral Mucosa.  5. Supple Neck, No JVD, No cervical lymphadenopathy appriciated, No Carotid Bruits.  6. Symmetrical Chest wall movement,  Good air movement bilaterally, CTAB.  7. RRR, No Gallops, Rubs or Murmurs, No Parasternal Heave.  8. Positive Bowel Sounds, Abdomen Soft, No tenderness, No organomegaly appriciated,No rebound -guarding or rigidity.  9.  No Cyanosis, Normal Skin Turgor, No Skin Rash or Bruise.  10. Good muscle tone,  joints appear normal , no effusions, Normal ROM.  11. No Palpable Lymph Nodes in Neck or Axillae  + spiders,  ? Slight palmar erythema right hand.   NSR at 70, nl axis, nl int,  No st-t changes c/w ischemia   Data Review:    CBC  Recent Labs Lab 29-Sep-2015 1719  WBC 5.4  HGB 10.1*  HCT 28.6*  PLT 77*  MCV 103.6*  MCH 36.6*  MCHC 35.3  RDW 15.6*   ------------------------------------------------------------------------------------------------------------------  Chemistries   Recent Labs Lab 29-Sep-2015 1719  NA 132*  K 2.2*  CL 100*  CO2 19*  GLUCOSE 108*  BUN 37*  CREATININE 2.93*  CALCIUM 9.4  AST 164*  ALT 62  ALKPHOS 175*  BILITOT 46.9*   ------------------------------------------------------------------------------------------------------------------ CrCl cannot be calculated (Unknown ideal weight.). ------------------------------------------------------------------------------------------------------------------ No results for input(s): TSH, T4TOTAL, T3FREE, THYROIDAB in the last 72 hours.  Invalid input(s): FREET3  Coagulation profile  Recent Labs Lab 29-Sep-2015 1719  INR 1.83*   ------------------------------------------------------------------------------------------------------------------- No results for input(s): DDIMER in the last 72 hours. -------------------------------------------------------------------------------------------------------------------  Cardiac Enzymes  Recent Labs Lab 29-Sep-2015 1839  TROPONINI <0.03   ------------------------------------------------------------------------------------------------------------------ No  results found for: BNP   ---------------------------------------------------------------------------------------------------------------  Urinalysis No results found for: COLORURINE, APPEARANCEUR, LABSPEC, PHURINE, GLUCOSEU, HGBUR, BILIRUBINUR, KETONESUR, PROTEINUR, UROBILINOGEN, NITRITE, LEUKOCYTESUR  ----------------------------------------------------------------------------------------------------------------   Imaging Results:    [place] Abdomen Complete  2016-05-04  CLINICAL DATA:  Elevated liver function studies and jaundice. EXAM: ABDOMEN ULTRASOUND COMPLETE COMPARISON:  Abdominal CT scan 05/21/2011 FINDINGS: Gallbladder: Mild gallbladder distention. No gallstones, wall thickening or pericholecystic fluid. Common bile duct: Diameter: 3.0 mm Liver: The liver contour is slightly irregular. Increased and coarse liver echogenicity with poor definition of the liver architecture and poor through transmission suggesting fatty infiltration. Cirrhosis is also possible. No focal hepatic lesions or intrahepatic biliary dilatation. IVC: Normal caliber Pancreas: Poorly visualized due to poor sonographic window. Spleen: Measures 20 x 17 x 10 cm.  Splenic volume is 1700 cubic cm. Right Kidney: Length: 15.0 cm. Normal renal cortical thickness and echogenicity without focal lesions or hydronephrosis. Left Kidney: Length: 12.8 cm. Normal renal cortical thickness and echogenicity without focal lesions or hydronephrosis. Abdominal aorta: Normal caliber Other findings: Mild ascites. IMPRESSION: 1. Suspect cirrhotic changes involving the liver and associated splenomegaly. No focal hepatic lesions or intrahepatic biliary dilatation. 2. Normal gallbladder and normal caliber common bile duct. 3. Poor visualization of the pancreas due to poor sonographic window. Electronically Signed   By: Rudie MeyerP.  Gallerani M.D.   On: 02017-11-06 19:58       Assessment & Plan:    Active Problems:   Abnormal liver function   Acute renal  failure (ARF) (HCC)   Hypokalemia   Anemia   Hyponatremia   Thrombocytopenia (HCC)    1. AMS ddx hepatic encephalopathy Check CT brain noncontrast Start lactulose   2.  Weakness secondary to hypokalemia Replete potassium, check magnesium  3. ARF Check urine sodium, urine creatinine , urine eosinophils Abdominal ultrasound => negative for hydronephrosis Stop lisinopril/hctz  4.  Anemia/ Thrombocytopenia Check cbc in am Type and screen  5.  Dm2 Stop metformin in light of renal failure,  Can't use this medicine when creatinine >1.5 fsbs ac and qhs, iss.   6. Cp per ED but pt points to more epigastric area.  protonix  iv bid  7. Abnormal lft Check tylenol level, acute hepatitis panel Consider ferritin, iron , tibc, ceruloplasmin, alpha 1 antitripsin, ama, ana, anti smooth muscle ab, celiac panel GI consultation, appreciate input  DVT Prophylaxis  SCDs   AM Labs Ordered, also please review Full Orders  Family Communication: Admission, patients condition and plan of care including tests being ordered have been discussed with the patient  who indicate understanding and agree with the plan and Code Status.  Code Status DNR  Likely DC to  rehab  Condition GUARDED    Consults called: GI per ED,  ED spoke with cardiology requested consultation in am  Admission status: inpatient  Time spent in minutes : 50 minutes   Pearson Grippe M.D on 12/29/15 at 9:04 PM  Between 7am to 7pm - Pager - [phone]. After 7pm go to [email or web address] - password [facility]  [facility] - Office  [phone]

## 2015-12-22 NOTE — Consult Note (Signed)
HPI :  50 y/o male with a history of "fatty liver disease" and presumed cirrhosis who presents today to the ER for multiple symptoms. I was consulted by Dr. Hyacinth Meeker in the ER this evening for assistance in management. He is followed by Dr. Elnoria Howard for Gastroenterology care.   The patient has a history of suspected cirrhosis, diabetes, OSA, chronic back pain, as outlined below. Patient presents with worsening lethargy, visual disturbance, ataxia, and worsening jaundice, symptoms ongoing for over a month. Wife is with the patient at bedside. She reports he seems confused at times, slow to respond, and "sleeps most of the time", but ongoing for the past month with interval worsening, not able to function. Patient reports subjectively his most concerning symptom is ataxia, feels unsteady to walk. He endorses poor appetite and some nausea recently. He denies any heavy use of alcohol in the past but does continue to drink alcohol, he states socially, last drink was yesterday. He endorses a strong family history of liver disease, multiple first degree relatives with "fatty liver" but unclear if they had cirrhosis or HCC. He endorsed some chest pain in the ER initially but denies at present time, some mild epigastric tenderness endorsed. He endorses some chronic LE edema but no history of ascites. He denies any recent medication use, supplements, or herbals. He denies tylenol use.    Past Medical History  Diagnosis Date  . Hyperlipemia     takes Fish Oil daily  . Arthritis   . Chronic low back pain     stenosis  . Chronic neck pain   . Sleep apnea   . Carpal tunnel syndrome   . Asthma     Albuterol daily as needed  . Anxiety     Valium daily as needed  . GERD (gastroesophageal reflux disease)     takes Nexium daily  . Hypertension     takes Lisinopril-HCTZ daily  . Depression     takes Prozac daily  . Diabetes mellitus     takes Metformin daily  . PONV (postoperative nausea and vomiting)   .  Sleep - wake disorder   . Shortness of breath dyspnea     lying too flat  . History of bronchitis 2015  . Dizziness   . Weakness     numbness and tingling-feet  . Multiple bruises     one large one from fall on back of right upper arm and tiny ones on left upper arm  . Sleep walking   . History of hiatal hernia   . History of colon polyps     precancerous  . Urinary urgency   . Nocturia   . Platelets decreased (HCC)     per pt "always low"  . Insomnia     doesn't take any meds  . Multiple falls   . Fatty liver disease, nonalcoholic   Suspected cirrhosis   Past Surgical History  Procedure Laterality Date  . Elbow surgery Bilateral   . Neck surgery  2007    fusion  . Back surgery  2009  . Left knuckle surgery      4th finger  . Wisdom teeth extracted    . Nose reset    . Tonsillectomy    . Colonoscopy    . Esophagogastroduodenoscopy     Family History  Problem Relation Age of Onset  . Heart disease Father   . Stroke Father   . Bipolar disorder Father   . Pneumonia Father   .  Liver disease Brother     Fatty liver   Social History  Substance Use Topics  . Smoking status: Former Smoker -- 20 years  . Smokeless tobacco: None     Comment: quit smoking 85yrs ago  . Alcohol Use: Yes     Comment: occasionally glass 19yr wine    Current Facility-Administered Medications  Medication Dose Route Frequency Provider Last Rate Last Dose  . pantoprazole (PROTONIX) EC tablet 40 mg  40 mg Oral BID Pearson Grippe, MD       Current Outpatient Prescriptions  Medication Sig Dispense Refill  . albuterol (PROVENTIL HFA;VENTOLIN HFA) 108 (90 BASE) MCG/ACT inhaler Inhale 2 puffs into the lungs every 6 (six) hours as needed.      . diazepam (VALIUM) 10 MG tablet Take 1 tablet by mouth 3 (three) times daily as needed.    Marland Kitchen esomeprazole (NEXIUM) 20 MG capsule Take 20 mg by mouth daily at 12 noon.    Marland Kitchen FLUoxetine (PROZAC) 20 MG capsule Take 20 mg by mouth daily.     Marland Kitchen gabapentin (NEURONTIN)  600 MG tablet Take 600 mg by mouth 3 (three) times daily.     Marland Kitchen lisinopril-hydrochlorothiazide (PRINZIDE,ZESTORETIC) 20-25 MG per tablet Take 1 tablet by mouth daily.      . metFORMIN (GLUCOPHAGE) 850 MG tablet Take 850 mg by mouth 2 (two) times daily with a meal.    . Multiple Vitamins-Minerals (MULTI COMPLETE PO) Take 1 tablet by mouth daily.    . naproxen sodium (ALEVE) 220 MG tablet Take 440 mg by mouth 3 (three) times daily as needed.    . ondansetron (ZOFRAN ODT) 4 MG disintegrating tablet Take 1 tablet (4 mg total) by mouth every 8 (eight) hours as needed for nausea or vomiting. 10 tablet 0   Allergies  Allergen Reactions  . Ancef [Cefazolin Sodium] Shortness Of Breath and Rash    Burning sensation  . Codeine Hives and Itching  . Iodine Hives  . Iohexol      Code: HIVES, Desc: rash and hives   . Sulfa Drugs Cross Reactors Hives     Review of Systems: All systems reviewed and negative except where noted in HPI.    US Abdomen Complete  2016/01/13  CLINICAL DATA:  Elevated liver function studies and jaundice. EXAM: ABDOMEN ULTRASOUND COMPLETE COMPARISON:  Abdominal CT scan 05/21/2011 FINDINGS: Gallbladder: Mild gallbladder distention. No gallstones, wall thickening or pericholecystic fluid. Common bile duct: Diameter: 3.0 mm Liver: The liver contour is slightly irregular. Increased and coarse liver echogenicity with poor definition of the liver architecture and poor through transmission suggesting fatty infiltration. Cirrhosis is also possible. No focal hepatic lesions or intrahepatic biliary dilatation. IVC: Normal caliber Pancreas: Poorly visualized due to poor sonographic window. Spleen: Measures 20 x 17 x 10 cm.  Splenic volume is 1700 cubic cm. Right Kidney: Length: 15.0 cm. Normal renal cortical thickness and echogenicity without focal lesions or hydronephrosis. Left Kidney: Length: 12.8 cm. Normal renal cortical thickness and echogenicity without focal lesions or hydronephrosis.  Abdominal aorta: Normal caliber Other findings: Mild ascites. IMPRESSION: 1. Suspect cirrhotic changes involving the liver and associated splenomegaly. No focal hepatic lesions or intrahepatic biliary dilatation. 2. Normal gallbladder and normal caliber common bile duct. 3. Poor visualization of the pancreas due to poor sonographic window. Electronically Signed   By: Rudie Meyer M.D.   On: 01/13/2016 19:58   Lab Results  Component Value Date   WBC 5.4 01-13-2016   HGB 10.1* 01-13-16  HCT 28.6* 12/26/2015   MCV 103.6* 12/21/2015   PLT 77* 12/26/2015   CBC Latest Ref Rng 12/26/2015 07/05/2015 06/26/2015  WBC 4.0 - 10.5 K/uL 5.4 9.4 5.4  Hemoglobin 13.0 - 17.0 g/dL 10.1(L) 11.9(L) 11.1(L)  Hematocrit 39.0 - 52.0 % 28.6(L) 35.6(L) 33.6(L)  Platelets 150 - 400 K/uL 77(L) 95(L) 83(L)      Lab Results  Component Value Date   ALT 62 12/05/2015   AST 164* 12/12/2015   ALKPHOS 175* 11/29/2015   BILITOT 46.9* 12/20/2015   Lab Results  Component Value Date   CREATININE 2.93* 11/28/2015   BUN 37* 12/07/2015   NA 132* 12/20/2015   K 2.2* 12/16/2015   CL 100* 12/24/2015   CO2 19* 12/27/2015    Alcohol level undetectable  Lab Results  Component Value Date   LIPASE 68* 12/18/2015    Lab Results  Component Value Date   INR 1.83* 12/19/2015   INR 1.20* 07/05/2015   INR 1.02 02/27/2010   PROTIME 14.4* 07/05/2015     Physical Exam: BP 105/66 mmHg  Pulse 76  Temp(Src) 98.3 F (36.8 C) (Oral)  Resp 18  SpO2 100% Constitutional: Pleasant, extremely jaundiced male HEENT: Normocephalic and atraumatic. (+) scleral icterus. Neck supple.  Cardiovascular: Normal rate, regular rhythm.  Pulmonary/chest: Effort normal and breath sounds normal. No wheezing, rales or rhonchi. Abdominal: Soft, protuberant, (+) spider angioma. Bowel sounds active throughout. There are no masses palpable.  Extremities: (+) 1-2 edema LE B Lymphadenopathy: No cervical adenopathy noted. Neurological:  Alert and oriented to person place and time but slow to respond. He is able to answer questions appropriately, taking his time to answer questions. Positive asterixis. Skin: Skin is warm and dry. Psychiatric: Normal mood and affect. Behavior is normal.   ASSESSMENT AND PLAN: 50 y/o male with history of fatty liver, unclear if alcohol related, with suspected cirrhosis / chronic liver disease, presenting with profound hyperbilirubinemia and coagulopathy, along with ataxia and mental status changes, and in acute renal failure.   Regarding worsening bilirubinemia and coagulopathy, he appears to have underlying chronic liver disease / cirrhosis that has decompensated, but need to assess for other contributing etiologies. He warrants imaging to evaluate the biliary tree and ensure no obstructive process, and ensure no evidence of HCC or thrombus. Other possible etiologies include alcoholic hepatitis, drug induced liver injury, other infectious etiologies, and other causes of chronic liver disease (autoimmune hepatitis, Wilson's, etc) and would evaluate for these. Of note, the patient has a chronic macrocytosis with continued alcohol ingestion in setting of severe liver disease, and while he minimizes his alcohol use, it is possible it is contributing although he has no leukocytosis that is typically seen with alcoholic hepatitis. Otherwise, I suspect he does have hepatic encephalopathy but given his other neurologic symptoms, Wernicke's encephalopathy is possible (again unsure of how much alcohol he drinks) and would also recommend brain imaging to exclude other primary CNS etiologies.   At this time recommend the following: -hepatic function panel to determine conjugated component of bilirubinemia, trend daily with INR -urine tox screen -tylenol level -acute viral hepatitis panel (rule out Hep A, B, C) -monospot / EBV now, consider CMV / HSV serologies pending course -MRI liver with MRCP to evaluate the  biliary tree, perform HCC screening - can do without contrast if patient's renal injury persists -labs to rule out other chronic liver diseases if not historically done: total IgG, ANA, smooth muscle AB, ceruloplasmin, alpha one antitrypsin, ferritin, TIBC -AFP level to  further rule out HCC -B12 / folate level -start lactulose TID, first dose now, titrate to goal 3 loose BMs per day. Consider adding rifaximin if no improvement and no other etiology noted. -start supplemental thiamine -brain imaging with MRI or CT per primary service given ataxia -trial of vitamin K supplementation -protonix daily for GI prophylaxis -neurochecks and monitor mental status closely. While this appears to be a chronic issue over the past several weeks and he appears stable, if he acutely worsens and unable to protect airway he would need intubation -no NSAIDs, avoid benzodiazepines / narcotics -low threshold to add empiric antibiotics, while he has ascites, only small amount reported and may be difficult to obtain fluid to rule out SBP  Dr. Elnoria HowardHung to assume GI care of this patient tomorrow morning.   Please call with questions in the interim.  Ileene PatrickSteven Kalyssa Anker, MD [facility] Pager [phone]

## 2015-12-22 NOTE — ED Notes (Signed)
Pt is now back from x-ray. Pt to be taken up to unit, 2C.

## 2015-12-22 NOTE — ED Notes (Signed)
Dr. Hyacinth MeekerMiller informed of pts K of 2.2 and Bilirubin of 46.9.

## 2015-12-23 DIAGNOSIS — K7290 Hepatic failure, unspecified without coma: Secondary | ICD-10-CM

## 2015-12-23 DIAGNOSIS — N179 Acute kidney failure, unspecified: Secondary | ICD-10-CM

## 2015-12-23 DIAGNOSIS — K7460 Unspecified cirrhosis of liver: Secondary | ICD-10-CM

## 2015-12-23 DIAGNOSIS — N183 Chronic kidney disease, stage 3 (moderate): Secondary | ICD-10-CM

## 2015-12-23 DIAGNOSIS — K7682 Hepatic encephalopathy: Secondary | ICD-10-CM

## 2015-12-23 LAB — GLUCOSE, CAPILLARY
GLUCOSE-CAPILLARY: 101 mg/dL — AB (ref 65–99)
GLUCOSE-CAPILLARY: 102 mg/dL — AB (ref 65–99)
GLUCOSE-CAPILLARY: 124 mg/dL — AB (ref 65–99)
GLUCOSE-CAPILLARY: 86 mg/dL (ref 65–99)
Glucose-Capillary: 99 mg/dL (ref 65–99)
Glucose-Capillary: 131 mg/dL — ABNORMAL HIGH (ref 65–99)

## 2015-12-23 LAB — CBC
HEMATOCRIT: 26.7 % — AB (ref 39.0–52.0)
HEMOGLOBIN: 9.3 g/dL — AB (ref 13.0–17.0)
MCH: 36.3 pg — ABNORMAL HIGH (ref 26.0–34.0)
MCHC: 34.8 g/dL (ref 30.0–36.0)
MCV: 104.3 fL — ABNORMAL HIGH (ref 78.0–100.0)
Platelets: 64 10*3/uL — ABNORMAL LOW (ref 150–400)
RBC: 2.56 MIL/uL — ABNORMAL LOW (ref 4.22–5.81)
RDW: 15.7 % — AB (ref 11.5–15.5)
WBC: 4.6 10*3/uL (ref 4.0–10.5)

## 2015-12-23 LAB — COMPREHENSIVE METABOLIC PANEL
ALK PHOS: 162 U/L — AB (ref 38–126)
ALT: 54 U/L (ref 17–63)
ANION GAP: 10 (ref 5–15)
AST: 151 U/L — ABNORMAL HIGH (ref 15–41)
Albumin: 1.9 g/dL — ABNORMAL LOW (ref 3.5–5.0)
BILIRUBIN TOTAL: 39.5 mg/dL — AB (ref 0.3–1.2)
BUN: 36 mg/dL — AB (ref 6–20)
CALCIUM: 8.7 mg/dL — AB (ref 8.9–10.3)
CO2: 20 mmol/L — ABNORMAL LOW (ref 22–32)
Chloride: 104 mmol/L (ref 101–111)
Creatinine, Ser: 2.84 mg/dL — ABNORMAL HIGH (ref 0.61–1.24)
GFR calc Af Amer: 28 mL/min — ABNORMAL LOW (ref >60)
GFR, EST NON AFRICAN AMERICAN: 24 mL/min — AB (ref >60)
Glucose, Bld: 110 mg/dL — ABNORMAL HIGH (ref 65–99)
POTASSIUM: 2.3 mmol/L — AB (ref 3.5–5.1)
Sodium: 134 mmol/L — ABNORMAL LOW (ref 135–145)
TOTAL PROTEIN: 5.3 g/dL — AB (ref 6.5–8.1)

## 2015-12-23 LAB — C DIFFICILE QUICK SCREEN W PCR REFLEX
C DIFFICILE (CDIFF) TOXIN: NEGATIVE
C DIFFICLE (CDIFF) ANTIGEN: POSITIVE — AB

## 2015-12-23 LAB — MRSA PCR SCREENING: MRSA by PCR: NEGATIVE

## 2015-12-23 LAB — TROPONIN I

## 2015-12-23 LAB — MAGNESIUM: Magnesium: 2.3 mg/dL (ref 1.7–2.4)

## 2015-12-23 LAB — VITAMIN B12: Vitamin B-12: 1294 pg/mL — ABNORMAL HIGH (ref 180–914)

## 2015-12-23 LAB — OCCULT BLOOD X 1 CARD TO LAB, STOOL: FECAL OCCULT BLD: POSITIVE — AB

## 2015-12-23 LAB — AMMONIA: AMMONIA: 60 umol/L — AB (ref 9–35)

## 2015-12-23 LAB — ACETAMINOPHEN LEVEL: Acetaminophen (Tylenol), Serum: <10 ug/mL — ABNORMAL LOW (ref 10–30)

## 2015-12-23 LAB — FIBRINOGEN: Fibrinogen: 267 mg/dL (ref 204–475)

## 2015-12-23 MED ORDER — LACTULOSE 10 GM/15ML PO SOLN
30.0000 g | Freq: Three times a day (TID) | ORAL | Status: DC
  Administered 2015-12-23 – 2015-12-24 (×5): 30 g via ORAL
  Filled 2015-12-23 (×5): qty 45

## 2015-12-23 MED ORDER — SODIUM CHLORIDE 0.9 % IV SOLN
INTRAVENOUS | Status: DC
  Administered 2015-12-23: 20:00:00 via INTRAVENOUS
  Filled 2015-12-23: qty 1000

## 2015-12-23 MED ORDER — THIAMINE HCL 100 MG/ML IJ SOLN
500.0000 mg | Freq: Three times a day (TID) | INTRAVENOUS | Status: DC
  Administered 2015-12-23 – 2015-12-24 (×3): 500 mg via INTRAVENOUS
  Filled 2015-12-23 (×5): qty 5

## 2015-12-23 MED ORDER — LACTATED RINGERS IV BOLUS (SEPSIS)
1000.0000 mL | Freq: Once | INTRAVENOUS | Status: AC
  Administered 2015-12-23: 1000 mL via INTRAVENOUS

## 2015-12-23 MED ORDER — POTASSIUM CHLORIDE 10 MEQ/100ML IV SOLN
10.0000 meq | INTRAVENOUS | Status: AC
  Administered 2015-12-23 (×5): 10 meq via INTRAVENOUS
  Filled 2015-12-23 (×5): qty 100

## 2015-12-23 MED ORDER — PREDNISOLONE SODIUM PHOSPHATE 15 MG/5ML PO SOLN
40.0000 mg | Freq: Every day | ORAL | Status: DC
  Administered 2015-12-23 – 2015-12-25 (×3): 40 mg via ORAL
  Filled 2015-12-23 (×4): qty 15

## 2015-12-23 NOTE — Progress Notes (Signed)
Pt arrived from the ED around 2330.  Pt is alert with intermittent confusion, soft blood pressures with maps of 60-70, RA, NSR with 1st degree HB, severe jaundice, complains of abdominal pain upon palpation.  RN will continue to monitor.

## 2015-12-23 NOTE — Progress Notes (Signed)
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 Patient is unable to provide any history secondary to his encephalopathy. Associated symptoms included nausea, poor by mouth intake, and loose stool for the past 2 months. In addition, his wife endorses increasing lower extremity edema over this past month. The patient continues to drink " Socially"with his last alcohol drink 12/21/2015. He denies any over-the-counter supplements, herbals, or excessive acetaminophen use.  In the ED, the patient was noted to have AST 164, ALT 62, phosphatase 175, total bilirubin 46.9. Ammonia was 64 with lipase 68. INR was 1.83. GI was consulted to assist with management. The patient normally follows Dr. Elnoria HowardHung in outpt setting  Assessment/Plan: Acute encephalopathy/hepatic encephalopathy -Ammonia 64 at the time of admission -Continue lactulose -Acute on chronic renal failure also contributing -Urinalysis is negative for pyuria -Check serum B12 -RBC folate -HIV -Hepatitis B surface antigen -Hepatitis C antibody -Thiamine 500 mg IV every 8 hours -CT brain negative for acute findings -RPR  Transaminasemia/Hyperbilirubinemia/Decompensated hepatic cirrhosis -Defer further workup of autoantibodies to GI if they feel prudent; unclear if this has been previously worked up by Dr. Elnoria HowardHung in outpt setting -AFP-->if elevated, MRI Liver -6/25 Abd US--fatty liver with possible cirrhosis, no focal hepatic lesions, splenomegaly, mild ascites, normal gallbladder  Acute on chronic renal failure--CKD 3 - baseline creatinine 1.4-1.7  -Secondary to volume depletion  -IV fluids  -Discontinue lisinopril  HCTZ   Thrombocytopenia/coagulopathy/ anemia -Likely due to chronic hepatic disease and splenomegaly -Check fibrinogen -Presenting INR 1.83 -Monitor for signs of bleeding -Baseline hemoglobin 10-11 -FOBT  Diabetes mellitus type 2 -Hemoglobin A1c  -06/20/2015 hemoglobin A1c 8.2  -NovoLog sliding scale  -Discontinue metformin   Hyponatremia  -Secondary to hepatic cirrhosis  -Continue to monitor  -A.m. BMP   Diarrhea -Follow up stool culture  -C diff antigen positive, toxin negative--suspect colonization  Hypokalemia -secondary to GI loss -replete -check Mag--2.3  Vomiting -continue PPI -mild lipase elevation nonspecific -clear for now  Disposition Plan:   Home in 3-4 days  Family Communication:   Family at bedside  Consultants:  GI--Hung  Code Status:   DNR  DVT Prophylaxis:  SCDs/Auto-anticoagulated   Procedures: As Listed in Progress Note Above  Antibiotics: None    Subjective:   Objective: Filed Vitals:   12/23/15 0100 12/23/15 0200 12/23/15 0350 12/23/15 0740  BP: 98/49 94/47 100/55 101/52  Pulse: 72 67 69 66  Temp:   98.6 F (37 C) 97.6 F (36.4 C)  TempSrc:   Oral Oral  Resp: 28 21 23 19   Height:      Weight:      SpO2: 99% 99% 100% 98%    Intake/Output Summary (Last 24 hours) at 12/23/15 0855 Last data filed at 12/23/15 0400  Gross per 24 hour  Intake    600 ml  Output      0 ml  Net    600 ml   Weight change:  Exam:   General:  Pt is alert, follows commands appropriately, not in acute distress  HEENT: No icterus, No thrush, No neck mass, [place]/AT  Cardiovascular: RRR, S1/S2, no rubs, no gallops  Respiratory: CTA bilaterally, no wheezing, no crackles, no rhonchi  Abdomen:  Soft/+BS, non tender, non distended, no guarding  Extremities: No edema, No lymphangitis, No petechiae, No rashes, no synovitis   Data Reviewed: I have personally reviewed following labs and imaging studies Basic Metabolic Panel:  Recent Labs Lab  December 29, 2015 1719 12/23/15 0020 12/23/15 0522  NA 132*  --  134*  K 2.2*  --  2.3*  CL 100*  --  104  CO2 19*  --  20*  GLUCOSE 108*  --  110*  BUN 37*  --  36*  CREATININE 2.93*  --  2.84*  CALCIUM 9.4  --  8.7*  MG  --  2.3  --    Liver Function Tests:  Recent Labs Lab 2015/12/29 1719 12/23/15 0522  AST 164* 151*  ALT 62 54  ALKPHOS 175* 162*  BILITOT 46.9* 39.5*  PROT 6.4* 5.3*  ALBUMIN 2.3* 1.9*    Recent Labs Lab December 29, 2015 1719  LIPASE 68*    Recent Labs Lab 2015-12-29 1839 12/23/15 0522  AMMONIA 64* 60*   Coagulation Profile:  Recent Labs Lab 12/29/2015 1719  INR 1.83*   CBC:  Recent Labs Lab 29-Dec-2015 1719 12/23/15 0522  WBC 5.4 4.6  HGB 10.1* 9.3*  HCT 28.6* 26.7*  MCV 103.6* 104.3*  PLT 77* 64*   Cardiac Enzymes:  Recent Labs Lab Dec 29, 2015 1839 12/23/15 0020 12/23/15 0522  TROPONINI <0.03 <0.03 <0.03   BNP: Invalid input(s): POCBNP CBG:  Recent Labs Lab 12/23/15 0109  GLUCAP 101*   HbA1C: No results for input(s): HGBA1C in the last 72 hours. Urine analysis:    Component Value Date/Time   COLORURINE ORANGE* 12/29/15 2208   APPEARANCEUR CLOUDY* 12-29-15 2208   LABSPEC 1.016 12/29/2015 2208   PHURINE 6.0 12-29-15 2208   GLUCOSEU NEGATIVE 12-29-15 2208   HGBUR NEGATIVE 12/29/15 2208   BILIRUBINUR LARGE* 29-Dec-2015 2208   KETONESUR 15* 12/29/15 2208   PROTEINUR NEGATIVE 2015/12/29 2208   NITRITE NEGATIVE 29-Dec-2015 2208   LEUKOCYTESUR SMALL* Dec 29, 2015 2208   Sepsis Labs: (procalcitonin:4,lacticidven:4) ) Recent Results (from the past 240 hour(s))  MRSA PCR Screening     Status: None   Collection Time: December 29, 2015 11:48 PM  Result Value Ref Range Status   MRSA by PCR NEGATIVE NEGATIVE Final    Comment:        The GeneXpert MRSA Assay (FDA approved for NASAL specimens only), is one component of a comprehensive MRSA colonization surveillance program. It is not intended to diagnose MRSA infection nor  to guide or monitor treatment for MRSA infections.   C difficile quick scan w PCR reflex     Status: Abnormal   Collection Time: 12/23/15  4:45 AM  Result Value Ref Range Status   C Diff antigen POSITIVE (A) NEGATIVE Final   C Diff toxin NEGATIVE NEGATIVE Final   C Diff interpretation   Final    C. difficile present, but toxin not detected. This indicates colonization. In most cases, this does not require treatment. If patient has signs and symptoms consistent with colitis, consider treatment. Requires ENTERIC precautions.     Scheduled Meds: . sodium chloride   Intravenous STAT  . FLUoxetine  20 mg Oral Daily  . insulin aspart  0-9 Units Subcutaneous TID WC  . lactulose  30 g Oral BID  . pantoprazole  40 mg Oral BID  . potassium chloride  10 mEq Intravenous Q1 Hr x 5  . sodium chloride flush  3 mL Intravenous Q12H   Continuous Infusions:   Procedures/Studies: Ct Head Wo  Contrast  2016-03-26  CLINICAL DATA:  Worsening lethargy and ataxia over the past month. EXAM: CT HEAD WITHOUT CONTRAST TECHNIQUE: Contiguous axial images were obtained from the base of the skull through the vertex without intravenous contrast. COMPARISON:  05/21/2011 FINDINGS: There is no intracranial hemorrhage, mass or evidence of acute infarction. There is no extra-axial fluid collection. There is mild to moderate generalized atrophy. Gray matter and white matter are unremarkable, with normal differentiation. Visible paranasal sinuses are clear except for mild retention cyst formation in the left maxillary sinus, unchanged. IMPRESSION: Mild generalized atrophy. No acute intracranial findings. The atrophy is worsened from 2012. Electronically Signed   By: Ellery Plunkaniel R Mitchell M.D.   On: 02017-09-28 23:18   [place] Abdomen Complete  2016-03-26  CLINICAL DATA:  Elevated liver function studies and jaundice. EXAM: ABDOMEN ULTRASOUND COMPLETE COMPARISON:  Abdominal CT scan 05/21/2011 FINDINGS: Gallbladder: Mild gallbladder  distention. No gallstones, wall thickening or pericholecystic fluid. Common bile duct: Diameter: 3.0 mm Liver: The liver contour is slightly irregular. Increased and coarse liver echogenicity with poor definition of the liver architecture and poor through transmission suggesting fatty infiltration. Cirrhosis is also possible. No focal hepatic lesions or intrahepatic biliary dilatation. IVC: Normal caliber Pancreas: Poorly visualized due to poor sonographic window. Spleen: Measures 20 x 17 x 10 cm.  Splenic volume is 1700 cubic cm. Right Kidney: Length: 15.0 cm. Normal renal cortical thickness and echogenicity without focal lesions or hydronephrosis. Left Kidney: Length: 12.8 cm. Normal renal cortical thickness and echogenicity without focal lesions or hydronephrosis. Abdominal aorta: Normal caliber Other findings: Mild ascites. IMPRESSION: 1. Suspect cirrhotic changes involving the liver and associated splenomegaly. No focal hepatic lesions or intrahepatic biliary dilatation. 2. Normal gallbladder and normal caliber common bile duct. 3. Poor visualization of the pancreas due to poor sonographic window. Electronically Signed   By: Rudie MeyerP.  Gallerani M.D.   On: 02017-09-28 19:58    Minette Manders, DO  [facility] Pager [phone]  If 7PM-7AM, please contact night-coverage [email or web address] Password [email or web address] 12/23/2015, 8:55 AM   LOS: 1 day

## 2015-12-23 NOTE — Progress Notes (Signed)
CRITICAL VALUE ALERT  Critical value received:  Potassium 2.3  Date of notification:  12/23/2015  Time of notification:  0618  Critical value read back: Yes  Nurse who received alert:  Rosamaria LintsKelsey Landin Tallon, RN  MD notified (1st page):  Benedetto Coons. Callahan  Time of first page:  [phone]

## 2015-12-23 NOTE — Care Management Note (Signed)
  Case Management Note  Patient Details  Name: [name] MRN: [id number] Date of Birth: June 14, 1966  Subjective/Objective:        Pt admitted with abnormal liver function          Action/Plan:  PTA independent from home with wife.  [facility]  CM left choice list with pt and wife - CM will continue to follow for discharge planning   Expected Discharge Date:                  Expected Discharge Plan:  Home w Home Health Services  In-House Referral:     Discharge planning Services  CM Consult  Post Acute Care Choice:    Choice offered to:     DME Arranged:    DME Agency:     HH Arranged:    HH Agency:     Status of Service:  In process, will continue to follow  If discussed at Long Length of Stay Meetings, dates discussed:    Additional Comments:  [name], Raymond Blitzer S, RN 12/23/2015, 2:38 PM

## 2015-12-23 NOTE — Progress Notes (Signed)
Subjective: Feeling poorly.  Objective: Vital signs in last 24 hours: Temp:  [97.6 F (36.4 C)-98.6 F (37 C)] 97.7 F (36.5 C) (06/26 1201) Pulse Rate:  [62-77] 72 (06/26 1201) Resp:  [18-28] 21 (06/26 1201) BP: (94-117)/(47-69) 98/55 mmHg (06/26 1201) SpO2:  [97 %-100 %] 100 % (06/26 1201) Weight:  [122.471 kg (270 lb)] 122.471 kg (270 lb) (06/26 0045) Last BM Date: 12/23/15  Intake/Output from previous day: 06/25 0701 - 06/26 0700 In: 900 [I.V.:800; IV Piggyback:100] Out: -  Intake/Output this shift: Total I/O In: 820 [P.O.:120; I.V.:500; IV Piggyback:200] Out: -   General appearance: lethargic, interactive GI: soft, non-tender; bowel sounds normal; no masses,  no organomegaly deeply jaundiced  Lab Results:  Recent Labs  12/25/2015 1719 12/23/15 0522  WBC 5.4 4.6  HGB 10.1* 9.3*  HCT 28.6* 26.7*  PLT 77* 64*   BMET  Recent Labs  12/12/2015 1719 12/23/15 0522  NA 132* 134*  K 2.2* 2.3*  CL 100* 104  CO2 19* 20*  GLUCOSE 108* 110*  BUN 37* 36*  CREATININE 2.93* 2.84*  CALCIUM 9.4 8.7*   LFT  Recent Labs  12/23/15 0522  PROT 5.3*  ALBUMIN 1.9*  AST 151*  ALT 54  ALKPHOS 162*  BILITOT 39.5*   PT/INR  Recent Labs  12/11/2015 1719  LABPROT 21.1*  INR 1.83*   Hepatitis Panel No results for input(s): HEPBSAG, HCVAB, HEPAIGM, HEPBIGM in the last 72 hours. C-Diff  Recent Labs  12/23/15 0445  CDIFFTOX NEGATIVE   Fecal Lactopherrin No results for input(s): FECLLACTOFRN in the last 72 hours.  Studies/Results: Ct Head Wo Contrast  12/25/2015  CLINICAL DATA:  Worsening lethargy and ataxia over the past month. EXAM: CT HEAD WITHOUT CONTRAST TECHNIQUE: Contiguous axial images were obtained from the base of the skull through the vertex without intravenous contrast. COMPARISON:  05/21/2011 FINDINGS: There is no intracranial hemorrhage, mass or evidence of acute infarction. There is no extra-axial fluid collection. There is mild to moderate  generalized atrophy. Gray matter and white matter are unremarkable, with normal differentiation. Visible paranasal sinuses are clear except for mild retention cyst formation in the left maxillary sinus, unchanged. IMPRESSION: Mild generalized atrophy. No acute intracranial findings. The atrophy is worsened from 2012. Electronically Signed   By: Ellery Plunkaniel R Mitchell M.D.   On: 11/30/2015 23:18   [place] Abdomen Complete  12/08/2015  CLINICAL DATA:  Elevated liver function studies and jaundice. EXAM: ABDOMEN ULTRASOUND COMPLETE COMPARISON:  Abdominal CT scan 05/21/2011 FINDINGS: Gallbladder: Mild gallbladder distention. No gallstones, wall thickening or pericholecystic fluid. Common bile duct: Diameter: 3.0 mm Liver: The liver contour is slightly irregular. Increased and coarse liver echogenicity with poor definition of the liver architecture and poor through transmission suggesting fatty infiltration. Cirrhosis is also possible. No focal hepatic lesions or intrahepatic biliary dilatation. IVC: Normal caliber Pancreas: Poorly visualized due to poor sonographic window. Spleen: Measures 20 x 17 x 10 cm.  Splenic volume is 1700 cubic cm. Right Kidney: Length: 15.0 cm. Normal renal cortical thickness and echogenicity without focal lesions or hydronephrosis. Left Kidney: Length: 12.8 cm. Normal renal cortical thickness and echogenicity without focal lesions or hydronephrosis. Abdominal aorta: Normal caliber Other findings: Mild ascites. IMPRESSION: 1. Suspect cirrhotic changes involving the liver and associated splenomegaly. No focal hepatic lesions or intrahepatic biliary dilatation. 2. Normal gallbladder and normal caliber common bile duct. 3. Poor visualization of the pancreas due to poor sonographic window. Electronically Signed   By: Rudie MeyerP.  Gallerani M.D.   On:  12/04/2015 19:58    Medications:  Scheduled: . FLUoxetine  20 mg Oral Daily  . insulin aspart  0-9 Units Subcutaneous TID WC  . lactulose  30 g Oral BID  .  pantoprazole  40 mg Oral BID  . sodium chloride flush  3 mL Intravenous Q12H  . thiamine IV  500 mg Intravenous Q8H   Continuous:   Assessment/Plan: 1) Cirrhosis. 2) Metabolic syndrome. 3) ETOH abuse. 4) Hepatic encephalopathy. 5) Hyperbilirubinemia.   The patient was last in my office this past January.  He did not follow up with the requested work up as a result of insurance issues.  My initial impression was that he had cirrhosis secondary to metabolic syndrome and exacerbated by his ETOH use.  There is a strong family history of liver disease.  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  I tracked his AST:ALT ratio since 2009 and there is a documented flip in his numbers on 07/05/2015.  Cirrhosis can give an AST:ALT ratio of 2:1, but the current ratio is 3:1.  There is renal insufficiency and it is difficult to discern if this is a prerenal issue as he has AMS and his PO intake is poor.  If his creatinine does not decline with adequate hydration the diagnosis of HRS needs to be worked up and a Renal consultation will be appropriate.  I do not feel that he requires work up for any other serologies in light of his ETOH use/abuse.  I was very frank with the patient and his wife that the situation is very serious and there is a high risk of death.  Plan: 1) Continue with lactulose.  In fact, it needs to be increased to TID. 2) Replenish electrolytes. 3) Watch for signs/symptoms of withdrawal. 4) Follow creatinine. 5) Increase IV hydration as dehydration will lead to hepatic encephalopathy.  Also, lactulose will induce diarrhea.  Need to correct or prevent any potential prerenal causes. 6) Start prednisolone.  LOS: 1 day   Kelsei Defino D 12/23/2015, 12:25 PM

## 2015-12-23 NOTE — Progress Notes (Signed)
Pt had a large, bloody, watery bowel movement.  Unable to get a sample due to the consistency of the stool and the events of the situation.  Pt also has pulled out 2 peripheral IV's.  Pt is now resting in bed with a new IV.  RN will continue to monitor.

## 2015-12-23 NOTE — Progress Notes (Deleted)
MD notified of critical potassium of 2.3 at 0618.

## 2015-12-24 ENCOUNTER — Inpatient Hospital Stay (HOSPITAL_COMMUNITY): Payer: 59

## 2015-12-24 DIAGNOSIS — M79606 Pain in leg, unspecified: Secondary | ICD-10-CM

## 2015-12-24 LAB — PROTIME-INR
INR: 2.19 — AB (ref 0.00–1.49)
PROTHROMBIN TIME: 24.2 s — AB (ref 11.6–15.2)

## 2015-12-24 LAB — COMPREHENSIVE METABOLIC PANEL
ALT: 59 U/L (ref 17–63)
AST: 171 U/L — ABNORMAL HIGH (ref 15–41)
Albumin: 1.9 g/dL — ABNORMAL LOW (ref 3.5–5.0)
Alkaline Phosphatase: 174 U/L — ABNORMAL HIGH (ref 38–126)
Anion gap: 10 (ref 5–15)
BUN: 33 mg/dL — ABNORMAL HIGH (ref 6–20)
CALCIUM: 8.8 mg/dL — AB (ref 8.9–10.3)
CHLORIDE: 108 mmol/L (ref 101–111)
CO2: 17 mmol/L — ABNORMAL LOW (ref 22–32)
CREATININE: 2.6 mg/dL — AB (ref 0.61–1.24)
GFR, EST AFRICAN AMERICAN: 31 mL/min — AB (ref >60)
GFR, EST NON AFRICAN AMERICAN: 27 mL/min — AB (ref >60)
Glucose, Bld: 132 mg/dL — ABNORMAL HIGH (ref 65–99)
Potassium: 3.4 mmol/L — ABNORMAL LOW (ref 3.5–5.1)
Sodium: 135 mmol/L (ref 135–145)
TOTAL PROTEIN: 5.5 g/dL — AB (ref 6.5–8.1)
Total Bilirubin: 42.6 mg/dL (ref 0.3–1.2)

## 2015-12-24 LAB — CBC
HCT: 26.9 % — ABNORMAL LOW (ref 39.0–52.0)
Hemoglobin: 9.2 g/dL — ABNORMAL LOW (ref 13.0–17.0)
MCH: 36.1 pg — ABNORMAL HIGH (ref 26.0–34.0)
MCHC: 34.2 g/dL (ref 30.0–36.0)
MCV: 105.5 fL — AB (ref 78.0–100.0)
PLATELETS: 66 10*3/uL — AB (ref 150–400)
RBC: 2.55 MIL/uL — AB (ref 4.22–5.81)
RDW: 16 % — ABNORMAL HIGH (ref 11.5–15.5)
WBC: 4.5 10*3/uL (ref 4.0–10.5)

## 2015-12-24 LAB — FOLATE RBC
FOLATE, RBC: 905 ng/mL (ref >498)
Folate, Hemolysate: 214.4 ng/mL
HEMATOCRIT: 23.7 % — AB (ref 37.5–51.0)

## 2015-12-24 LAB — HEPATITIS PANEL, ACUTE
HCV Ab: <0.1 s/co ratio (ref 0.0–0.9)
HEP B S AG: NEGATIVE
Hep A IgM: NEGATIVE
Hep B C IgM: NEGATIVE

## 2015-12-24 LAB — GLUCOSE, CAPILLARY
GLUCOSE-CAPILLARY: 124 mg/dL — AB (ref 65–99)
GLUCOSE-CAPILLARY: 143 mg/dL — AB (ref 65–99)
GLUCOSE-CAPILLARY: 131 mg/dL — AB (ref 65–99)
Glucose-Capillary: 149 mg/dL — ABNORMAL HIGH (ref 65–99)
Glucose-Capillary: 127 mg/dL — ABNORMAL HIGH (ref 65–99)

## 2015-12-24 LAB — CLOSTRIDIUM DIFFICILE BY PCR: Toxigenic C. Difficile by PCR: POSITIVE — AB

## 2015-12-24 LAB — HIV ANTIBODY (ROUTINE TESTING W REFLEX): HIV SCREEN 4TH GENERATION: NONREACTIVE

## 2015-12-24 LAB — RPR: RPR Ser Ql: NONREACTIVE

## 2015-12-24 LAB — AMMONIA: AMMONIA: 66 umol/L — AB (ref 9–35)

## 2015-12-24 LAB — HEPATITIS B SURFACE ANTIGEN: Hepatitis B Surface Ag: NEGATIVE

## 2015-12-24 LAB — HEMATOLOGY COMMENTS:

## 2015-12-24 LAB — CALCIUM / CREATININE RATIO, URINE
Calcium, Ur: <0.8 mg/dL
Creatinine, Urine: 151.7 mg/dL

## 2015-12-24 LAB — HEMOGLOBIN A1C
Hgb A1c MFr Bld: <4.2 % — ABNORMAL LOW (ref 4.8–5.6)
Mean Plasma Glucose: <74 mg/dL

## 2015-12-24 LAB — AFP TUMOR MARKER: AFP TUMOR MARKER: 3.2 ng/mL (ref 0.0–8.3)

## 2015-12-24 MED ORDER — METRONIDAZOLE 500 MG PO TABS
500.0000 mg | ORAL_TABLET | Freq: Three times a day (TID) | ORAL | Status: DC
  Administered 2015-12-24 – 2015-12-25 (×4): 500 mg via ORAL
  Filled 2015-12-24 (×5): qty 1

## 2015-12-24 MED ORDER — THIAMINE HCL 100 MG/ML IJ SOLN
500.0000 mg | Freq: Every day | INTRAVENOUS | Status: DC
  Administered 2015-12-26: 500 mg via INTRAVENOUS
  Filled 2015-12-24: qty 5

## 2015-12-24 MED ORDER — POTASSIUM CHLORIDE 10 MEQ/100ML IV SOLN
10.0000 meq | INTRAVENOUS | Status: AC
  Administered 2015-12-24 (×4): 10 meq via INTRAVENOUS
  Filled 2015-12-24 (×6): qty 100

## 2015-12-24 MED ORDER — ALBUMIN HUMAN 5 % IV SOLN
12.5000 g | Freq: Once | INTRAVENOUS | Status: AC
  Administered 2015-12-24: 12.5 g via INTRAVENOUS
  Filled 2015-12-24: qty 250

## 2015-12-24 MED ORDER — SODIUM CHLORIDE 0.9 % IV SOLN
INTRAVENOUS | Status: AC
  Administered 2015-12-24: 15:00:00 via INTRAVENOUS

## 2015-12-24 MED ORDER — POTASSIUM CHLORIDE 2 MEQ/ML IV SOLN
INTRAVENOUS | Status: DC
  Administered 2015-12-24: 13:00:00 via INTRAVENOUS
  Filled 2015-12-24 (×3): qty 1000

## 2015-12-24 MED ORDER — THIAMINE HCL 100 MG/ML IJ SOLN
500.0000 mg | Freq: Three times a day (TID) | INTRAVENOUS | Status: AC
  Administered 2015-12-24 – 2015-12-25 (×3): 500 mg via INTRAVENOUS
  Filled 2015-12-24 (×3): qty 5

## 2015-12-24 MED ORDER — PHYTONADIONE 5 MG PO TABS
10.0000 mg | ORAL_TABLET | Freq: Once | ORAL | Status: AC
  Administered 2015-12-24: 10 mg via ORAL
  Filled 2015-12-24: qty 2

## 2015-12-24 NOTE — Progress Notes (Signed)
Subjective: Feeling better.  More alert.  Objective: Vital signs in last 24 hours: Temp:  [97.3 F (36.3 C)-98.8 F (37.1 C)] 98.8 F (37.1 C) (06/27 1203) Pulse Rate:  [66-73] 69 (06/27 1203) Resp:  [17-24] 20 (06/27 1203) BP: (93-115)/(48-68) 105/55 mmHg (06/27 1203) SpO2:  [99 %-100 %] 100 % (06/27 1203) Last BM Date: 12/23/15  Intake/Output from previous day: 06/26 0701 - 06/27 0700 In: 3952.5 [P.O.:510; I.V.:1342.5; IV Piggyback:2100] Out: 1265 [Urine:865; Stool:400] Intake/Output this shift:    General appearance: more alert, but still slow in thought and movement. GI: soft, non-tender; bowel sounds normal; no masses,  no organomegaly Extremities: + asterixis  Lab Results:  Recent Labs  09/11/2015 1719 12/23/15 0522 12/23/15 1004 12/24/15 0226  WBC 5.4 4.6  --  4.5  HGB 10.1* 9.3*  --  9.2*  HCT 28.6* 26.7* 23.7* 26.9*  PLT 77* 64*  --  66*   BMET  Recent Labs  09/11/2015 1719 12/23/15 0522 12/24/15 0226  NA 132* 134* 135  K 2.2* 2.3* 3.4*  CL 100* 104 108  CO2 19* 20* 17*  GLUCOSE 108* 110* 132*  BUN 37* 36* 33*  CREATININE 2.93* 2.84* 2.60*  CALCIUM 9.4 8.7* 8.8*   LFT  Recent Labs  12/24/15 0226  PROT 5.5*  ALBUMIN 1.9*  AST 171*  ALT 59  ALKPHOS 174*  BILITOT 42.6*   PT/INR  Recent Labs  09/11/2015 1719 12/24/15 0226  LABPROT 21.1* 24.2*  INR 1.83* 2.19*   Hepatitis Panel  Recent Labs  09/11/2015 2158 12/23/15 1004  HEPBSAG Negative Negative  HCVAB <0.1  --   HEPAIGM Negative  --   HEPBIGM Negative  --    C-Diff  Recent Labs  12/23/15 0445  CDIFFTOX NEGATIVE   Fecal Lactopherrin No results for input(s): FECLLACTOFRN in the last 72 hours.  Studies/Results: Ct Head Wo Contrast  March 02, 2016  CLINICAL DATA:  Worsening lethargy and ataxia over the past month. EXAM: CT HEAD WITHOUT CONTRAST TECHNIQUE: Contiguous axial images were obtained from the base of the skull through the vertex without intravenous contrast.  COMPARISON:  05/21/2011 FINDINGS: There is no intracranial hemorrhage, mass or evidence of acute infarction. There is no extra-axial fluid collection. There is mild to moderate generalized atrophy. Gray matter and white matter are unremarkable, with normal differentiation. Visible paranasal sinuses are clear except for mild retention cyst formation in the left maxillary sinus, unchanged. IMPRESSION: Mild generalized atrophy. No acute intracranial findings. The atrophy is worsened from 2012. Electronically Signed   By: Ellery Plunkaniel R Mitchell M.D.   On: 0September 04, 2017 23:18   [place] Abdomen Complete  March 02, 2016  CLINICAL DATA:  Elevated liver function studies and jaundice. EXAM: ABDOMEN ULTRASOUND COMPLETE COMPARISON:  Abdominal CT scan 05/21/2011 FINDINGS: Gallbladder: Mild gallbladder distention. No gallstones, wall thickening or pericholecystic fluid. Common bile duct: Diameter: 3.0 mm Liver: The liver contour is slightly irregular. Increased and coarse liver echogenicity with poor definition of the liver architecture and poor through transmission suggesting fatty infiltration. Cirrhosis is also possible. No focal hepatic lesions or intrahepatic biliary dilatation. IVC: Normal caliber Pancreas: Poorly visualized due to poor sonographic window. Spleen: Measures 20 x 17 x 10 cm.  Splenic volume is 1700 cubic cm. Right Kidney: Length: 15.0 cm. Normal renal cortical thickness and echogenicity without focal lesions or hydronephrosis. Left Kidney: Length: 12.8 cm. Normal renal cortical thickness and echogenicity without focal lesions or hydronephrosis. Abdominal aorta: Normal caliber Other findings: Mild ascites. IMPRESSION: 1. Suspect cirrhotic changes involving the liver  and associated splenomegaly. No focal hepatic lesions or intrahepatic biliary dilatation. 2. Normal gallbladder and normal caliber common bile duct. 3. Poor visualization of the pancreas due to poor sonographic window. Electronically Signed   By: Rudie MeyerP.  Gallerani  M.D.   On: 12/18/2015 19:58    Medications:  Scheduled: . sodium chloride   Intravenous STAT  . FLUoxetine  20 mg Oral Daily  . insulin aspart  0-9 Units Subcutaneous TID WC  . lactulose  30 g Oral TID  . pantoprazole  40 mg Oral BID  . prednisoLONE  40 mg Oral Daily  . sodium chloride flush  3 mL Intravenous Q12H  . thiamine IV  500 mg Intravenous Q8H  . [START ON 12/26/2015] thiamine IV  500 mg Intravenous Daily   Continuous: . sodium chloride 0.9 % 1,000 mL with potassium chloride 40 mEq infusion 100 mL/hr at 12/24/15 1235    Assessment/Plan: 1) Hepatic failure. 2) Hepatic encephalopathy. 3) Jaundice. 4) Renal insufficiency. 5) ETOH abuse. 6) Metabolic syndrome.   Clinically he appears better, but his INR has worsened.  There is no net improvement, but no net worsening.  He appears to have good urine output.  I will increase his IV fluids to 200 ml per hour.  His overall prognosis is still poor, but I am hoping that he can improve in the short term.  Plan: 1) Continue with supportive care.   LOS: 2 days   Chicquita Mendel D 12/24/2015, 2:27 PM

## 2015-12-24 NOTE — Progress Notes (Signed)
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 Patient is unable to provide any history secondary to his encephalopathy. Associated symptoms included nausea, poor by mouth intake, and loose stool for the past 2 months. In addition, his wife endorses increasing lower extremity edema over this past month. The patient continues to drink " Socially"with his last alcohol drink 12/21/2015. He denies any over-the-counter supplements, herbals, or excessive acetaminophen use. In the ED, the patient was noted to have AST 164, ALT 62, phosphatase 175, total bilirubin 46.9. Ammonia was 64 with lipase 68. INR was 1.83. GI was consulted to assist with management. The patient normally follows Dr. Elnoria HowardHung in outpt setting  Assessment/Plan: Acute encephalopathy/hepatic encephalopathy -Ammonia 64 at the time of admission-->60-->66 -Continue lactulose -Acute on chronic renal failure also contributing -Urinalysis is negative for pyuria -Check serum B12--1294 -RBC folate--pending -HIV--neg -Hepatitis B surface antigen--negative -Hepatitis C antibody--pending -Thiamine 500 mg IV every 8 hours x 6 doses, then 500mg  daily x 3 days -CT brain negative for acute findings -RPR--neg -12/24/15--mentation slightly better, quicker to respond  Transaminasemia/Hyperbilirubinemia/Decompensated hepatic cirrhosis -Defer further workup of autoantibodies to GI if they feel prudent; unclear if this has been previously worked up by Dr. Elnoria HowardHung in outpt setting -AFP-->if elevated, MRI Liver -6/25 Abd US--fatty liver with possible cirrhosis, no focal hepatic lesions, splenomegaly, mild ascites, normal  gallbladder  Acute on chronic renal failure--CKD 3 - baseline creatinine 1.4-1.7  -Secondary to volume depletion from GI losses and third spacing fluid -continue IV fluids  -Discontinue lisinopril HCTZ  -serum creatinine 2.93-->2.60 -if does not improve, would need to consider hepatorenal sydrome  Thrombocytopenia/coagulopathy/ anemia -Likely due to chronic hepatic disease and splenomegaly -Check fibrinogen--267 -Presenting INR 1.83-->2.19 -start po vitamin K if no DVT -Monitor for signs of bleeding -Baseline hemoglobin 10-11 -FOBT-positive  Diabetes mellitus type 2 -Hemoglobin A1c--pending -06/20/2015 hemoglobin A1c 8.2  -NovoLog sliding scale  -Discontinue metformin   Hyponatremia  -Secondary to hepatic cirrhosis and volume depletion -Continue to monitor  -A.m. BMP   Diarrhea -Follow up stool culture  -C diff antigen positive, toxin negative--suspect colonization -now due to lactulose -12/23/15--flexi-seal inserted  Hypokalemia -secondary to GI loss -replete -check Mag--2.3  Vomiting -continue PPI -mild lipase elevation nonspecific -advance to full liquids   Lower extremity pain and edema -venous duplex  Disposition Plan: Home in 3-4 days; hopeful transfer out of SDU in 24 hours Family Communication: Wife updated at bedside 6/27;  Total time 35 min; >50 spent counseling and coordinating care  Consultants: GI--Hung  Code Status: DNR  DVT Prophylaxis: SCDs/Auto-anticoagulated   Procedures: As Listed in Progress Note Above  Antibiotics: None  Subjective: Patient denies fevers, chills, headache, chest pain, dyspnea, nausea, vomiting, diarrhea, abdominal pain, dysuria, hematuria.  No hematochezia or melena.   Objective: Filed Vitals:   12/23/15 2330 12/24/15 0000 12/24/15 0344 12/24/15 0817  BP: 105/60 111/62 112/58 110/54  Pulse: 69 67 69 66  Temp:   97.6 F (36.4 C) 97.6 F (36.4 C)  TempSrc:   Oral Oral  Resp: 23 24 19 21     Height:      Weight:      SpO2: 100% 100% 100% 100%    Intake/Output  Summary (Last 24 hours) at 12/24/15 0942 Last data filed at 12/24/15 0344  Gross per 24 hour  Intake 3352.5 ml  Output   1265 ml  Net 2087.5 ml   Weight change:  Exam:   General:  Pt is alert, follows commands appropriately, not in acute distress  HEENT: ++ icterus, No thrush, No neck mass, [place]/AT  Cardiovascular: RRR, S1/S2, no rubs, no gallops  Respiratory: bibasilar crackles, no wheeze  Abdomen: Soft/+BS, non tender, non distended, no guarding  Extremities: 2 + LE edema, No lymphangitis, No petechiae, No rashes, no synovitis   Data Reviewed: I have personally reviewed following labs and imaging studies Basic Metabolic Panel:  Recent Labs Lab 12/06/2015 1719 12/23/15 0020 12/23/15 0522 12/24/15 0226  NA 132*  --  134* 135  K 2.2*  --  2.3* 3.4*  CL 100*  --  104 108  CO2 19*  --  20* 17*  GLUCOSE 108*  --  110* 132*  BUN 37*  --  36* 33*  CREATININE 2.93*  --  2.84* 2.60*  CALCIUM 9.4  --  8.7* 8.8*  MG  --  2.3  --   --    Liver Function Tests:  Recent Labs Lab 12/17/2015 1719 12/23/15 0522 12/24/15 0226  AST 164* 151* 171*  ALT 62 54 59  ALKPHOS 175* 162* 174*  BILITOT 46.9* 39.5* 42.6*  PROT 6.4* 5.3* 5.5*  ALBUMIN 2.3* 1.9* 1.9*    Recent Labs Lab 12/21/2015 1719  LIPASE 68*    Recent Labs Lab 12/21/2015 1839 12/23/15 0522 12/24/15 0226  AMMONIA 64* 60* 66*   Coagulation Profile:  Recent Labs Lab 12/27/2015 1719 12/24/15 0226  INR 1.83* 2.19*   CBC:  Recent Labs Lab 12/19/2015 1719 12/23/15 0522 12/24/15 0226  WBC 5.4 4.6 4.5  HGB 10.1* 9.3* 9.2*  HCT 28.6* 26.7* 26.9*  MCV 103.6* 104.3* 105.5*  PLT 77* 64* 66*   Cardiac Enzymes:  Recent Labs Lab 12/12/2015 1839 12/23/15 0020 12/23/15 0522 12/23/15 0948  TROPONINI <0.03 <0.03 <0.03 <0.03   BNP: Invalid input(s): POCBNP CBG:  Recent Labs Lab 12/23/15 1703 12/23/15 2002 12/23/15 2339  12/24/15 0340 12/24/15 0930  GLUCAP 102* 131* 124* 124* 149*   HbA1C: No results for input(s): HGBA1C in the last 72 hours. Urine analysis:    Component Value Date/Time   COLORURINE ORANGE* 12/04/2015 2208   APPEARANCEUR CLOUDY* 12/02/2015 2208   LABSPEC 1.016 12/24/2015 2208   PHURINE 6.0 11/29/2015 2208   GLUCOSEU NEGATIVE 12/10/2015 2208   HGBUR NEGATIVE 12/07/2015 2208   BILIRUBINUR LARGE* 12/10/2015 2208   KETONESUR 15* 12/07/2015 2208   PROTEINUR NEGATIVE 11/28/2015 2208   NITRITE NEGATIVE 11/28/2015 2208   LEUKOCYTESUR SMALL* 12/24/2015 2208   Sepsis Labs: (procalcitonin:4,lacticidven:4) ) Recent Results (from the past 240 hour(s))  MRSA PCR Screening     Status: None   Collection Time: 12/24/2015 11:48 PM  Result Value Ref Range Status   MRSA by PCR NEGATIVE NEGATIVE Final    Comment:        The GeneXpert MRSA Assay (FDA approved for NASAL specimens only), is one component of a comprehensive MRSA colonization surveillance program. It is not intended to diagnose MRSA infection nor to guide or monitor treatment for MRSA infections.   C difficile quick scan w PCR reflex     Status: Abnormal   Collection Time: 12/23/15  4:45 AM  Result Value Ref Range Status   C Diff antigen POSITIVE (A) NEGATIVE Final  C Diff toxin NEGATIVE NEGATIVE Final   C Diff interpretation   Final    C. difficile present, but toxin not detected. This indicates colonization. In most cases, this does not require treatment. If patient has signs and symptoms consistent with colitis, consider treatment. Requires ENTERIC precautions.     Scheduled Meds: . FLUoxetine  20 mg Oral Daily  . insulin aspart  0-9 Units Subcutaneous TID WC  . lactulose  30 g Oral TID  . pantoprazole  40 mg Oral BID  . potassium chloride  10 mEq Intravenous Q1 Hr x 4  . prednisoLONE  40 mg Oral Daily  . sodium chloride flush  3 mL Intravenous Q12H  . thiamine IV  500 mg Intravenous Q8H   Continuous  Infusions: . sodium chloride 0.9 % 1,000 mL with potassium chloride 40 mEq infusion 100 mL/hr at 12/24/15 0813    Procedures/Studies: Ct Head Wo Contrast  2016/06/20  CLINICAL DATA:  Worsening lethargy and ataxia over the past month. EXAM: CT HEAD WITHOUT CONTRAST TECHNIQUE: Contiguous axial images were obtained from the base of the skull through the vertex without intravenous contrast. COMPARISON:  05/21/2011 FINDINGS: There is no intracranial hemorrhage, mass or evidence of acute infarction. There is no extra-axial fluid collection. There is mild to moderate generalized atrophy. Gray matter and white matter are unremarkable, with normal differentiation. Visible paranasal sinuses are clear except for mild retention cyst formation in the left maxillary sinus, unchanged. IMPRESSION: Mild generalized atrophy. No acute intracranial findings. The atrophy is worsened from 2012. Electronically Signed   By: Ellery Plunkaniel R Mitchell M.D.   On: 02017/12/23 23:18   [place] Abdomen Complete  2016/06/20  CLINICAL DATA:  Elevated liver function studies and jaundice. EXAM: ABDOMEN ULTRASOUND COMPLETE COMPARISON:  Abdominal CT scan 05/21/2011 FINDINGS: Gallbladder: Mild gallbladder distention. No gallstones, wall thickening or pericholecystic fluid. Common bile duct: Diameter: 3.0 mm Liver: The liver contour is slightly irregular. Increased and coarse liver echogenicity with poor definition of the liver architecture and poor through transmission suggesting fatty infiltration. Cirrhosis is also possible. No focal hepatic lesions or intrahepatic biliary dilatation. IVC: Normal caliber Pancreas: Poorly visualized due to poor sonographic window. Spleen: Measures 20 x 17 x 10 cm.  Splenic volume is 1700 cubic cm. Right Kidney: Length: 15.0 cm. Normal renal cortical thickness and echogenicity without focal lesions or hydronephrosis. Left Kidney: Length: 12.8 cm. Normal renal cortical thickness and echogenicity without focal lesions or  hydronephrosis. Abdominal aorta: Normal caliber Other findings: Mild ascites. IMPRESSION: 1. Suspect cirrhotic changes involving the liver and associated splenomegaly. No focal hepatic lesions or intrahepatic biliary dilatation. 2. Normal gallbladder and normal caliber common bile duct. 3. Poor visualization of the pancreas due to poor sonographic window. Electronically Signed   By: Rudie MeyerP.  Gallerani M.D.   On: 02017/12/23 19:58    Shelly Spenser, DO  [facility] Pager [phone]  If 7PM-7AM, please contact night-coverage [email or web address] Password [email or web address] 12/24/2015, 9:42 AM   LOS: 2 days

## 2015-12-24 NOTE — Care Management Note (Signed)
Case Management Note  Patient Details  Name: [name] MRN: [id number] Date of Birth: 1966-04-08  Subjective/Objective:       CM following for progression and d/c plannin.             Action/Plan: 12/24/2015 Met briefly with pt wife, who has been given a list of [place] agencies for [facility] services. She states that she has not had a chance to consider the agencies offered. Will check with her again tomorrow for decision. Will continue to follow and assist with d/c as needed.   Expected Discharge Date:                  Expected Discharge Plan:  [place]  In-House Referral:     Discharge planning Services  CM Consult  Post Acute Care Choice:    Choice offered to:     DME Arranged:    DME Agency:     HH Arranged:    [place] Agency:     Status of Service:  In process, will continue to follow  If discussed at Long Length of Stay Meetings, dates discussed:    Additional Comments:  Adron Bene, RN 12/24/2015, 12:27 PM

## 2015-12-24 NOTE — Progress Notes (Addendum)
*  Preliminary Results* Bilateral lower extremity venous duplex completed. Bilateral lower extremities are negative for deep vein thrombosis. There is evidence of left Baker's cyst, no right Baker's cyst.  12/24/2015  Gertie FeyMichelle Zalyn Amend, RVT, RDCS, RDMS

## 2015-12-25 DIAGNOSIS — E876 Hypokalemia: Secondary | ICD-10-CM

## 2015-12-25 DIAGNOSIS — Z7189 Other specified counseling: Secondary | ICD-10-CM | POA: Insufficient documentation

## 2015-12-25 DIAGNOSIS — D696 Thrombocytopenia, unspecified: Secondary | ICD-10-CM

## 2015-12-25 DIAGNOSIS — Z515 Encounter for palliative care: Secondary | ICD-10-CM | POA: Insufficient documentation

## 2015-12-25 DIAGNOSIS — E871 Hypo-osmolality and hyponatremia: Secondary | ICD-10-CM

## 2015-12-25 DIAGNOSIS — K7200 Acute and subacute hepatic failure without coma: Principal | ICD-10-CM

## 2015-12-25 LAB — COMPREHENSIVE METABOLIC PANEL
ALBUMIN: 2.1 g/dL — AB (ref 3.5–5.0)
ALK PHOS: 191 U/L — AB (ref 38–126)
ALT: 70 U/L — AB (ref 17–63)
ANION GAP: 9 (ref 5–15)
AST: 179 U/L — ABNORMAL HIGH (ref 15–41)
BILIRUBIN TOTAL: 41.7 mg/dL — AB (ref 0.3–1.2)
BUN: 38 mg/dL — ABNORMAL HIGH (ref 6–20)
CHLORIDE: 115 mmol/L — AB (ref 101–111)
CO2: 13 mmol/L — AB (ref 22–32)
Calcium: 8.6 mg/dL — ABNORMAL LOW (ref 8.9–10.3)
Creatinine, Ser: 2.64 mg/dL — ABNORMAL HIGH (ref 0.61–1.24)
GFR calc non Af Amer: 26 mL/min — ABNORMAL LOW (ref >60)
GFR, EST AFRICAN AMERICAN: 31 mL/min — AB (ref >60)
GLUCOSE: 133 mg/dL — AB (ref 65–99)
POTASSIUM: 3.7 mmol/L (ref 3.5–5.1)
Sodium: 137 mmol/L (ref 135–145)
Total Protein: 5.5 g/dL — ABNORMAL LOW (ref 6.5–8.1)

## 2015-12-25 LAB — CBC
HEMATOCRIT: 28 % — AB (ref 39.0–52.0)
HEMOGLOBIN: 9.4 g/dL — AB (ref 13.0–17.0)
MCH: 36.6 pg — AB (ref 26.0–34.0)
MCHC: 33.6 g/dL (ref 30.0–36.0)
MCV: 108.9 fL — AB (ref 78.0–100.0)
Platelets: 85 10*3/uL — ABNORMAL LOW (ref 150–400)
RBC: 2.57 MIL/uL — AB (ref 4.22–5.81)
RDW: 16.3 % — ABNORMAL HIGH (ref 11.5–15.5)
WBC: 7.4 10*3/uL (ref 4.0–10.5)

## 2015-12-25 LAB — GLUCOSE, CAPILLARY
GLUCOSE-CAPILLARY: 105 mg/dL — AB (ref 65–99)
GLUCOSE-CAPILLARY: 114 mg/dL — AB (ref 65–99)
GLUCOSE-CAPILLARY: 124 mg/dL — AB (ref 65–99)
GLUCOSE-CAPILLARY: 131 mg/dL — AB (ref 65–99)
Glucose-Capillary: 152 mg/dL — ABNORMAL HIGH (ref 65–99)

## 2015-12-25 MED ORDER — LACTULOSE 10 GM/15ML PO SOLN
20.0000 g | Freq: Two times a day (BID) | ORAL | Status: DC
  Administered 2015-12-25 (×2): 20 g via ORAL
  Filled 2015-12-25 (×2): qty 30

## 2015-12-25 MED ORDER — SODIUM CHLORIDE 0.45 % IV SOLN
INTRAVENOUS | Status: DC
Start: 2015-12-25 — End: 2015-12-26
  Administered 2015-12-25 – 2015-12-26 (×2): via INTRAVENOUS
  Filled 2015-12-25 (×2): qty 925

## 2015-12-25 MED ORDER — SODIUM CHLORIDE 0.45 % IV SOLN: INTRAVENOUS | Status: DC

## 2015-12-25 MED ORDER — RIFAXIMIN 550 MG PO TABS
550.0000 mg | ORAL_TABLET | Freq: Two times a day (BID) | ORAL | Status: DC
  Administered 2015-12-25 (×2): 550 mg via ORAL
  Filled 2015-12-25 (×4): qty 1

## 2015-12-25 NOTE — Consult Note (Signed)
Consultation Note Date: 12/25/2015   Patient Name: [name]  DOB: Sep 19, 1965  MRN: [id number]  Age / Sex: 50 y.o., male  PCP: Kristie Cowman, MD Referring Physician: Modena Jansky, MD  Reason for Consultation: Establishing goals of care  HPI/Patient Profile: 50 y.o. 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 Patient was profoundly encephalopathic and unable to provide history upon presentation to ED with his wife, Suanne Marker. He was admitted for management of acute hepatic encephalopathy, decompensated hepatic failure, and acute kidney disease. Calculated MELD score today of 39 indicates very poor prognosis with 3 month mortality at approximately 52.6%. Patient is appropriately a DNR status.  Clinical Assessment and Goals of Care:  Met with spouse, Suanne Marker to discuss patient's disease trajectory and goals of care. Suanne Marker states that disease symptoms began with nosebleed in early December and patient has declined since then, most notably with decrease in his memory. He refused to pursue healthcare as he was waiting on Medicare disability to be approved and he did not want to burden his wife with medical bills. They are still waiting on this to be approved. Suanne Marker is aware that patient's time is limited. Discussed the MELD score with Suanne Marker and the implications that her husband's life expectancy cannot be predicted exactly, but there is high probability that he will not survive more than 6-8 weeks. They have a 69 year old daughter who recently moved out of the home, she is aware of her father's poor prognosis. Suanne Marker agrees that an appropriate plan is to monitor patient's labs, and should they continue to trend  in a negative direction showing continued multiorgan failure, then patient's care should be transitioned to comfort care. Discussed possibility of residential hospice depending on patient's trajectory over the next few days.  NEXT OF KIN- wife- Tysean [place]    SUMMARY OF RECOMMENDATIONS    Code Status/Advance Care Planning:  DNR    Symptom Management:   Continue to monitor. Consider transition to comfort care if patient continues to decline with multiorgan failure.   Additional Recommendations (Limitations, Scope, Preferences):  No Artificial Feeding, No Hemodialysis and No Tracheostomy  Psycho-social/Spiritual:   Additional Recommendations: Education on Hospice  Prognosis:   < 3 months d/t hepatorenal syndrome, multiorgan failure, significant decline in patient's functional status and mentation, MELD score 38, patient meets Hospice criteria for admission based on liver disease criteria (PT 24.2; Serum albumin 1.9, with hepatorenal syndrome, and ongoing alcohol use).  Discharge Planning: To Be Determined - dependent upon patient's trajectory over the next few days- may be home with Hospice if some functional status improves, otherwise will like be eligible for residential hospice pending Hospice approval      Primary Diagnoses: Present on Admission:  . Abnormal liver function . Acute renal failure (ARF) ([place]) . Hypokalemia . Anemia . Acute liver failure . Altered mental status  I have reviewed the medical record, interviewed the patient and family, and examined the patient. The following aspects are pertinent.  Past Medical History  Diagnosis Date  . Hyperlipemia     takes Fish Oil daily  . Arthritis   . Chronic low back pain     stenosis  . Chronic neck pain   . Sleep apnea   . Carpal tunnel syndrome   . Asthma     Albuterol daily as needed  . Anxiety     Valium daily as needed  . GERD (gastroesophageal reflux disease)     takes Nexium daily  .  Hypertension     takes Lisinopril-HCTZ daily  . Depression     takes Prozac daily  . Diabetes mellitus     takes Metformin daily  . PONV (postoperative nausea and vomiting)   . Sleep - wake disorder   . Shortness of breath dyspnea     lying too flat  . History of bronchitis 2015  . Dizziness   . Weakness     numbness and tingling-feet  . Multiple bruises     one large one from fall on back of right upper arm and tiny ones on left upper arm  . Sleep walking   . History of hiatal hernia   . History of colon polyps     precancerous  . Urinary urgency   . Nocturia   . Platelets decreased ([place])     per pt "always low"  . Insomnia     doesn't take any meds  . Multiple falls   . Fatty liver disease, nonalcoholic    Social History   Social History  . Marital Status: Married    Spouse Name: N/A  . Number of Children: N/A  . Years of Education: N/A   Social History Main Topics  . Smoking status: Former Smoker -- 20 years  . Smokeless tobacco: None     Comment: quit smoking 59yr ago  . Alcohol Use: Yes     Comment: occasionally glass of wine   . Drug Use: Yes    Special: Marijuana     Comment: does daily for pain  . Sexual Activity: Not Currently   Other Topics Concern  . None   Social History Narrative   Drinks about 1-2 cups of caffeine daily.   Family History  Problem Relation Age of Onset  . Heart disease Father   . Stroke Father   . Bipolar disorder Father   . Pneumonia Father   . Liver disease Brother     Fatty liver   Scheduled Meds: . FLUoxetine  20 mg Oral Daily  . insulin aspart  0-9 Units Subcutaneous TID WC  . lactulose  20 g Oral BID  . metroNIDAZOLE  500 mg Oral Q8H  . pantoprazole  40 mg Oral BID  . prednisoLONE  40 mg Oral Daily  . rifaximin  550 mg Oral BID  . sodium chloride flush  3 mL Intravenous Q12H  . [START ON 12/26/2015] thiamine IV  500 mg Intravenous Daily   Continuous Infusions: . sodium chloride 0.45 % 925 mL with sodium  bicarbonate 75 mEq infusion 75 mL/hr at 12/25/15 1157   PRN Meds:.ondansetron Medications Prior to Admission:  Prior to Admission medications   Medication Sig Start Date End Date Taking? Authorizing Provider  albuterol (PROVENTIL HFA;VENTOLIN HFA) 108 (90 BASE) MCG/ACT inhaler Inhale 2 puffs into the lungs every 6 (six) hours as needed.  Yes Historical Provider, MD  diazepam (VALIUM) 10 MG tablet Take 1 tablet by mouth 3 (three) times daily as needed. 05/15/15  Yes Historical Provider, MD  esomeprazole (NEXIUM) 20 MG capsule Take 20 mg by mouth daily at 12 noon.   Yes Historical Provider, MD  FLUoxetine (PROZAC) 20 MG capsule Take 20 mg by mouth daily.    Yes Historical Provider, MD  gabapentin (NEURONTIN) 600 MG tablet Take 600 mg by mouth 3 (three) times daily.    Yes Historical Provider, MD  lisinopril-hydrochlorothiazide (PRINZIDE,ZESTORETIC) 20-25 MG per tablet Take 1 tablet by mouth daily.     Yes Historical Provider, MD  metFORMIN (GLUCOPHAGE) 850 MG tablet Take 850 mg by mouth 2 (two) times daily with a meal.   Yes Historical Provider, MD  Multiple Vitamins-Minerals (MULTI COMPLETE PO) Take 1 tablet by mouth daily.   Yes Historical Provider, MD  naproxen sodium (ALEVE) 220 MG tablet Take 440 mg by mouth 3 (three) times daily as needed.   Yes Historical Provider, MD  ondansetron (ZOFRAN ODT) 4 MG disintegrating tablet Take 1 tablet (4 mg total) by mouth every 8 (eight) hours as needed for nausea or vomiting. 06/26/15  Yes Carlisle Cater, PA-C   Allergies  Allergen Reactions  . Ancef [Cefazolin Sodium] Shortness Of Breath and Rash    Burning sensation  . Codeine Hives and Itching  . Iodine Hives  . Iohexol      Code: HIVES, Desc: rash and hives   . Sulfa Drugs Cross Reactors Hives   Review of Systems  Unable to perform ROS: Mental status change    Physical Exam  Constitutional: He appears well-developed. No distress.  HENT:  Head: Normocephalic and atraumatic.    Cardiovascular: Normal rate and regular rhythm.   Pulmonary/Chest: Effort normal.  Genitourinary:  Foley in place, dark urine  Skin:  Severe jaundice  Psychiatric:  Slurred speech, answered questions appropriately    Vital Signs: BP 129/60 mmHg  Pulse 73  Temp(Src) 97.6 F (36.4 C) (Oral)  Resp 18  Ht 6' 1"  (1.854 m)  Wt 122.471 kg (270 lb)  BMI 35.63 kg/m2  SpO2 100% Pain Assessment: No/denies pain POSS *See Group Information*: S-Acceptable,Sleep, easy to arouse     SpO2: SpO2: 100 % O2 Device:SpO2: 100 % O2 Flow Rate: .   IO: Intake/output summary:  Intake/Output Summary (Last 24 hours) at 12/25/15 1525 Last data filed at 12/25/15 1300  Gross per 24 hour  Intake 2544.67 ml  Output   1480 ml  Net 1064.67 ml    LBM: Last BM Date: 12/24/15 Baseline Weight: Weight: 122.471 kg (270 lb) Most recent weight: Weight:  (bed is broken)     Palliative Assessment/Data: PPS: 30%     Time In: 1430 Time Out: 1550 Time Total:80 minutes Greater than 50%  of this time was spent counseling and coordinating care related to the above assessment and plan.    Thank you for this consult. We will continue to follow this patient and provide support and planning assistance to patient and family.   Signed by:  Mariana Kaufman, AGNP-C   Please contact Palliative Medicine Team phone at [phone] for questions and concerns.  For individual provider: See Shea Evans

## 2015-12-25 NOTE — Progress Notes (Signed)
Subjective: Wife reports that he is hallucinating.  Objective: Vital signs in last 24 hours: Temp:  [97.6 F (36.4 C)-99 F (37.2 C)] 97.6 F (36.4 C) (06/28 0804) Pulse Rate:  [67-77] 73 (06/28 0804) Resp:  [13-23] 13 (06/28 0804) BP: (102-127)/(52-67) 102/67 mmHg (06/28 0804) SpO2:  [86 %-100 %] 100 % (06/28 0804) Last BM Date: 12/24/15  Intake/Output from previous day: 06/27 0701 - 06/28 0700 In: 3031.7 [P.O.:240; I.V.:2241.7; IV Piggyback:550] Out: 2155 [Urine:955; Stool:1200] Intake/Output this shift:    General appearance: arousable, responsive, but answers are incorrect GI: soft, non-tender; bowel sounds normal; no masses,  no organomegaly  Lab Results:  Recent Labs  12/23/15 0522 12/23/15 1004 12/24/15 0226 12/25/15 0526  WBC 4.6  --  4.5 7.4  HGB 9.3*  --  9.2* 9.4*  HCT 26.7* 23.7* 26.9* 28.0*  PLT 64*  --  66* 85*   BMET  Recent Labs  12/23/15 0522 12/24/15 0226 12/25/15 0526  NA 134* 135 137  K 2.3* 3.4* 3.7  CL 104 108 115*  CO2 20* 17* 13*  GLUCOSE 110* 132* 133*  BUN 36* 33* 38*  CREATININE 2.84* 2.60* 2.64*  CALCIUM 8.7* 8.8* 8.6*   LFT  Recent Labs  12/25/15 0526  PROT 5.5*  ALBUMIN 2.1*  AST 179*  ALT 70*  ALKPHOS 191*  BILITOT 41.7*   PT/INR  Recent Labs  12/06/2015 1719 12/24/15 0226  LABPROT 21.1* 24.2*  INR 1.83* 2.19*   Hepatitis Panel  Recent Labs  12/01/2015 2158 12/23/15 1004  HEPBSAG Negative Negative  HCVAB <0.1  --   HEPAIGM Negative  --   HEPBIGM Negative  --    C-Diff  Recent Labs  12/23/15 0445  CDIFFTOX NEGATIVE   Fecal Lactopherrin No results for input(s): FECLLACTOFRN in the last 72 hours.  Studies/Results: No results found.  Medications:  Scheduled: . FLUoxetine  20 mg Oral Daily  . insulin aspart  0-9 Units Subcutaneous TID WC  . lactulose  30 g Oral TID  . metroNIDAZOLE  500 mg Oral Q8H  . pantoprazole  40 mg Oral BID  . prednisoLONE  40 mg Oral Daily  . sodium chloride flush   3 mL Intravenous Q12H  . [START ON 12/26/2015] thiamine IV  500 mg Intravenous Daily   Continuous:   Assessment/Plan: 1) Hepatic failure. 2) Cirrhosis. 3) History of ETOH abuse. 4) Metabolic syndrome. 5) Renal failure. 6) C. Diff colonization.   Bicarbonate has decreased.  I think it is reasonable to ask Renal to consult for HRS as his creatinine has not continued to improve.  He still has good urine output.  No new INR this AM, but clinically he does not appear well.  Plan: 1) Continue with supportive care. 2) Okay with Renal consultation. 3) Check INR in the AM. 4) Continue with prednisolone for now.   LOS: 3 days   Ece Cumberland D 12/25/2015, 8:23 AM

## 2015-12-25 NOTE — Progress Notes (Signed)
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 Patient was unable to provide any history secondary to his encephalopathy. Associated symptoms included nausea, poor by mouth intake, and loose stool for the past 2 months. In addition, his wife endorsed increasing lower extremity edema over this past month. The patient continued to drink " Socially"with his last alcohol drink 12/21/2015. He denied any over-the-counter supplements, herbals, or excessive acetaminophen use. In the ED, the patient was noted to have AST 164, ALT 62, phosphatase 175, total bilirubin 46.9. Ammonia was 64 with lipase 68. INR was 1.83. Patient is being managed for acute hepatic encephalopathy, decompensated hepatic cirrhosis/hepatic failure, acute on chronic kidney disease. GI and nephrology were consulted. Critically ill and poor prognosis. Palliative care consulted for goals of care on 6/28  Assessment/Plan: Acute encephalopathy/hepatic encephalopathy -Ammonia 64 at the time of admission-->60-->66 -Continue lactulose: Due to profuse diarrhea, after discussing with Dr. Elnoria HowardHung, lactulose dose was decreased to 20 mg twice daily and added rifaximin on 6/28. -Acute on chronic renal failure also contributing -Urinalysis is negative for pyuria -Check serum B12--1294 -RBC folate--905 -HIV--neg -Hepatitis B surface antigen--negative, Hepatitis C antibody--<0.1; hep A, IgM: Negative; hep B core AB, IgM: Negative - Continue thiamine supplementation. -CT brain negative for acute findings -RPR--neg - Still encephalopathic. As per spouse, intermittent visual hallucinations.  Monitor clinically and periodic ammonia level.   Transaminasemia/Hyperbilirubinemia/Decompensated hepatic cirrhosis -Defer further workup of autoantibodies to GI if they feel prudent; unclear if this has been previously worked up by Dr. Elnoria HowardHung in outpt setting -AFP: 3.2  -6/25 Abd US--fatty liver with possible cirrhosis, no focal hepatic lesions, splenomegaly, mild ascites, normal gallbladder - Discussed with Dr. Elnoria HowardHung, GI on 6/28. Poor prognosis. Continue prednisone for possible alcoholic hepatitis. Agrees with palliative care consultation for goals of care. Meld score 37.   Acute on chronic renal failure--CKD 3 - baseline creatinine 1.4-1.7  -Secondary to volume depletion from GI losses and third spacing fluid -continue IV fluids  -Discontinue lisinopril HCTZ  -serum creatinine 2.93-->2.60 -plateaued in this range on 6/28.  - Nephrology was consulted due to concern for hepatorenal syndrome and significant non-anion gap metabolic acidosis. As per nephrology input, he may have an element of prerenal as a tenia but cannot rule out hepatorenal syndrome. Obstruction not suspected. Started on bicarbonate drip.   Thrombocytopenia/coagulopathy/ anemia -Likely due to chronic hepatic disease and splenomegaly/cirrhosis.  -Check fibrinogen--267 -Presenting INR 1.83-->2.19 -start po vitamin K if no DVT -Monitor for signs of bleeding-no overt bleeding reported. -Baseline hemoglobin 10-11 -FOBT-positive - Hemoglobin and platelet counts stable.   Diabetes mellitus type 2 -Hemoglobi<4.2.  -NovoLog sliding scale  -Discontinued metformin   Hyponatremia  -Secondary to hepatic cirrhosis and volume depletion -Continue to monitor  - Resolved for now.  Diarrhea -Follow up stool culture: Negative for Escherichia coli, rest pending.  -C diff antigen positive, toxin negative--suspect colonization. As per Dr. Don Perkingat's discussion with ID, on oral Flagyl.  -now due to lactulose-reduced dose on 6/28    -12/23/15--flexi-seal inserted. Copious diarrhea.   Hypokalemia -secondary to GI loss -replete as needed and follow. -check Mag--2.3  Vomiting -continue PPI -mild lipase elevation nonspecific -advance to full liquids . Seems  to have improved.   Lower extremity pain and edema -venous duplex on 6/27: Negative for DVT.   Alcohol abuse - No overt withdrawal. Apparently drank up to admission.   Non-anion gap metabolic acidosis  - Secondary to diarrhea. Management as indicated above. Bicarbonate drip started by nephrology. Follow BMP in a.m.     Disposition Plan: remains in stepdown unit. Not medically stable for transfer out or discharge at this time.  Family Communication: Wife updated at bedside 6/28;  Total time 35 min; >50 spent counseling and coordinating care Code Status: DNR DVT Prophylaxis: SCDs/Auto-anticoagulated    Consultants:  GI--Hung, nephrology and palliative care team   Procedures: As Listed in Progress Note Above  Antibiotics: Oral Flagyl Rifaximin   Subjective: As per spouse at bedside, occasional and intermittent visual hallucinations-seems to be drinking out of a cup when that is no cup in his hand. Ongoing diarrhea-has rectal tube. No vomiting reported. Denies any other complaints.    Objective: Filed Vitals:   12/25/15 0200 12/25/15 0400 12/25/15 0804 12/25/15 1100  BP: 114/58 127/63 102/67 129/60  Pulse: 74 73 73 73  Temp:  99 F (37.2 C) 97.6 F (36.4 C) 97.6 F (36.4 C)  TempSrc:  Oral Oral Oral  Resp: 23 19 13 18   Height:      Weight:      SpO2: 100% 100% 100% 100%    Intake/Output Summary (Last 24 hours) at 12/25/15 1333 Last data filed at 12/25/15 1000  Gross per 24 hour  Intake 2504.67 ml  Output   1980 ml  Net 524.67 ml   Weight change:  Exam:   General:  Very ill looking middle-aged male lying comfortably propped up in bed. Scleral and skin icterus. Appears lethargic.  HEENT: ++ icterus, No thrush, No neck  mass, [place]/AT  Cardiovascular: RRR, S1/S2, no rubs, no gallops. Telemetry: Sinus rhythm.  Respiratory: reduced breath sounds in the bases but otherwise clear to auscultation. No increased work of breathing.   Abdomen: abdomen is mildly distended but soft and nontender. Ascites +. Normal bowel sounds heard. Rectal tube and Foley catheter +.  Extremities: 1 + LE edema, No lymphangitis, No petechiae, No rashes, no synovitis. Bilateral lower extremity SCDs.    CNS: Sluggish but oriented in person and place. Follows instructions appropriately. Asterixis +.   Data Reviewed: I have personally reviewed following labs and imaging studies Basic Metabolic Panel:  Recent Labs Lab December 25, 2015 1719 12/23/15 0020 12/23/15 0522 12/24/15 0226 12/25/15 0526  NA 132*  --  134* 135 137  K 2.2*  --  2.3* 3.4* 3.7  CL 100*  --  104 108 115*  CO2 19*  --  20* 17* 13*  GLUCOSE 108*  --  110* 132* 133*  BUN 37*  --  36* 33* 38*  CREATININE 2.93*  --  2.84* 2.60* 2.64*  CALCIUM 9.4  --  8.7* 8.8* 8.6*  MG  --  2.3  --   --   --    Liver Function Tests:  Recent Labs Lab 12-25-2015 1719 12/23/15 0522 12/24/15 0226 12/25/15 0526  AST 164* 151* 171* 179*  ALT 62 54 59 70*  ALKPHOS 175* 162* 174* 191*  BILITOT 46.9* 39.5* 42.6* 41.7*  PROT 6.4* 5.3* 5.5* 5.5*  ALBUMIN 2.3* 1.9* 1.9* 2.1*    Recent Labs Lab 2015/12/25 1719  LIPASE 68*    Recent Labs Lab 12/25/2015 1839 12/23/15 0522 12/24/15 0226  AMMONIA 64* 60* 66*   Coagulation Profile:  Recent Labs Lab 11/30/2015 1719 12/24/15 0226  INR 1.83* 2.19*   CBC:  Recent Labs Lab 11/28/2015 1719 12/23/15 0522 12/23/15 1004 12/24/15 0226 12/25/15 0526  WBC 5.4 4.6  --  4.5 7.4  HGB 10.1* 9.3*  --  9.2* 9.4*  HCT 28.6* 26.7* 23.7* 26.9* 28.0*  MCV 103.6* 104.3*  --  105.5* 108.9*  PLT 77* 64*  --  66* 85*   Cardiac Enzymes:  Recent Labs Lab 12/09/2015 1839 12/23/15 0020 12/23/15 0522 12/23/15 0948  TROPONINI <0.03 <0.03 <0.03  <0.03   BNP: Invalid input(s): POCBNP CBG:  Recent Labs Lab 12/24/15 2010 12/25/15 0020 12/25/15 0445 12/25/15 0801 12/25/15 1217  GLUCAP 131* 124* 152* 105* 114*   HbA1C:  Recent Labs  12/23/15 0020  HGBA1C <4.2*   Urine analysis:    Component Value Date/Time   COLORURINE ORANGE* 12/23/2015 2208   APPEARANCEUR CLOUDY* 11/29/2015 2208   LABSPEC 1.016 12/21/2015 2208   PHURINE 6.0 12/16/2015 2208   GLUCOSEU NEGATIVE 12/21/2015 2208   HGBUR NEGATIVE 12/09/2015 2208   BILIRUBINUR LARGE* 12/25/2015 2208   KETONESUR 15* 12/05/2015 2208   PROTEINUR NEGATIVE 11/29/2015 2208   NITRITE NEGATIVE 12/27/2015 2208   LEUKOCYTESUR SMALL* 12/01/2015 2208   Sepsis Labs: @LABRCNTIP (procalcitonin:4,lacticidven:4) ) Recent Results (from the past 240 hour(s))  MRSA PCR Screening     Status: None   Collection Time: 12/26/2015 11:48 PM  Result Value Ref Range Status   MRSA by PCR NEGATIVE NEGATIVE Final    Comment:        The GeneXpert MRSA Assay (FDA approved for NASAL specimens only), is one component of a comprehensive MRSA colonization surveillance program. It is not intended to diagnose MRSA infection nor to guide or monitor treatment for MRSA infections.   Stool culture (children & immunocomp patients)     Status: None (Preliminary result)   Collection Time: 12/23/15  4:45 AM  Result Value Ref Range Status   Salmonella/Shigella Screen PENDING  Incomplete   Campylobacter Culture PENDING  Incomplete   E coli, Shiga toxin Assay Negative Negative Final    Comment: (NOTE) Performed At: [facility]  [address] [place], [place] [id number] Mila HomerHancock William F MD NW:[id number]:[phone]   C difficile quick scan w PCR reflex     Status: Abnormal   Collection Time: 12/23/15  4:45 AM  Result Value Ref Range Status   C Diff antigen POSITIVE (A) NEGATIVE Final   C Diff toxin NEGATIVE NEGATIVE Final   C Diff interpretation   Final    C. difficile present, but toxin not detected.  This indicates colonization. In most cases, this does not require treatment. If patient has signs and symptoms consistent with colitis, consider treatment. Requires ENTERIC precautions.  Clostridium Difficile by PCR     Status: Abnormal   Collection Time: 12/23/15  4:45 AM  Result Value Ref Range Status   Toxigenic C Difficile by pcr POSITIVE (A) NEGATIVE Final    Comment: CRITICAL RESULT CALLED TO, READ BACK BY AND VERIFIED WITH: Ivar Drape. SNIDER MD 15:45 12/24/15 ([email or web address])      Scheduled Meds: . FLUoxetine  20 mg Oral Daily  . insulin aspart  0-9 Units Subcutaneous TID WC  . lactulose  20 Ruiz Oral BID  . metroNIDAZOLE  500 mg Oral Q8H  . pantoprazole  40 mg Oral BID  . prednisoLONE  40 mg Oral Daily  . rifaximin  550 mg Oral BID  . sodium chloride flush  3 mL Intravenous Q12H  . [START  ON 12/26/2015] thiamine IV  500 mg Intravenous Daily   Continuous Infusions: . sodium chloride 0.45 % 925 mL with sodium bicarbonate 75 mEq infusion 75 mL/hr at 12/25/15 1157    Procedures/Studies: Ct Head Wo Contrast  2016/01/06  CLINICAL DATA:  Worsening lethargy and ataxia over the past month. EXAM: CT HEAD WITHOUT CONTRAST TECHNIQUE: Contiguous axial images were obtained from the base of the skull through the vertex without intravenous contrast. COMPARISON:  05/21/2011 FINDINGS: There is no intracranial hemorrhage, mass or evidence of acute infarction. There is no extra-axial fluid collection. There is mild to moderate generalized atrophy. Gray matter and white matter are unremarkable, with normal differentiation. Visible paranasal sinuses are clear except for mild retention cyst formation in the left maxillary sinus, unchanged. IMPRESSION: Mild generalized atrophy. No acute intracranial findings. The atrophy is worsened from 2012. Electronically Signed   By: Ellery Plunk M.D.   On: Jan 06, 2016 23:18   US Abdomen Complete  06-Jan-2016  CLINICAL DATA:  Elevated liver function studies and jaundice. EXAM:  ABDOMEN ULTRASOUND COMPLETE COMPARISON:  Abdominal CT scan 05/21/2011 FINDINGS: Gallbladder: Mild gallbladder distention. No gallstones, wall thickening or pericholecystic fluid. Common bile duct: Diameter: 3.0 mm Liver: The liver contour is slightly irregular. Increased and coarse liver echogenicity with poor definition of the liver architecture and poor through transmission suggesting fatty infiltration. Cirrhosis is also possible. No focal hepatic lesions or intrahepatic biliary dilatation. IVC: Normal caliber Pancreas: Poorly visualized due to poor sonographic window. Spleen: Measures 20 x 17 x 10 cm.  Splenic volume is 1700 cubic cm. Right Kidney: Length: 15.0 cm. Normal renal cortical thickness and echogenicity without focal lesions or hydronephrosis. Left Kidney: Length: 12.8 cm. Normal renal cortical thickness and echogenicity without focal lesions or hydronephrosis. Abdominal aorta: Normal caliber Other findings: Mild ascites. IMPRESSION: 1. Suspect cirrhotic changes involving the liver and associated splenomegaly. No focal hepatic lesions or intrahepatic biliary dilatation. 2. Normal gallbladder and normal caliber common bile duct. 3. Poor visualization of the pancreas due to poor sonographic window. Electronically Signed   By: Rudie Meyer M.D.   On: 01-06-2016 19:58    Daxson Reffett, MD, FACP, FHM. [facility] Pager [phone]  If 7PM-7AM, please contact night-coverage [email or web address] Password [email or web address] 12/25/2015, 1:51 PM   LOS: 3 days

## 2015-12-25 NOTE — Progress Notes (Signed)
Pt does not take pills whole very well and has a hard time swallowing.  Pills should be crushed and given in apple sauce.

## 2015-12-25 NOTE — Consult Note (Signed)
Reason for Consult: Renal Failure Referring Physician: Dr. Algis Liming  Chief Complaint: Confusion  Assessment/Plan: 1. Acute Kidney Injury - Patient has a history of CKD IIIA as well with a B/L cr ~1.7. He may have an element of prerenal azotemia but cannot r/o HRS. He had initial improvement in the creatinine from time of presentation after saline was given but with the lactulose he has required a rectal tube and currently cannot r/o prerenal azotemia. Obstruction is less likely with a foley in place and no reported  H/o obstruction. Creatinine in Mar or April would have been very helpful as this may be HRSII. Lisinopril/ HCTZ discontinued.  - Check a bladder scan to be complete but i am not expecting to find an obstruction with the foley in place (a lot of sediment). - Will perform a urine microscopy --> probably won't be that helpful in this patient as ATN and HRS will both have pigmented casts --> not impressive (a few pigmented casts, ~3 WBC/hpf, no dysmorphic RBC's - Will also check a UAG --> nongap acidosis may be from the diarrhea. - Will also start 1/2 NS + 1.5 amps of HCO3 _0 /hr x2 L --> will help with possible prerenal component as well as replenish the HCO3 lost in the stool. - He has a high MELD score and I don't believe he would benefit from RRT if it became necessary. 2. Hepatic encephalopathy - being treated with Lactulose with mild improvement in MS. 3. Cirrhosis - seen by GI. MELD score of 37. 4. DM - Metformin already D/C by primary team 5. Hyponatremia - mild and secondary to the decreased effective arterial circulating volume. 6. Diarrhea   HPI: [name] is an 50 y.o. male with a history of probably cirrhosis, diabetes, OSA, chronic LBP (off opioids), and CKD IIIA with a BL creatinine of 1.7 06/2015. Next creatinine observed was during this hospitalization when it was elevated at 2.8. He presented with lethargy, altered mental status, worsening jaundice and ataxia  but no falls or head trauma. He has also had anorexia and some nausea per spouse who was bedside. He continues to drink alcohol and carries a dignosis of NASH with a strong FH of liver disease as well. He also uses NSAID's 3x per week consistently now that he is no longer on opioids for the LBP. He denies any obstructive like symptoms, nephrolithiasis, herbal remedies, flank pain, hematuria or dysuria. He has had diarrhea at home which has worsened since coming to the hospital. He denies fever, chills, dyspnea or rashes.  ROS Pertinent items are noted in HPI.  Chemistry and CBC: CREATININE  Date/Time Value Ref Range Status  07/05/2015 11:51 AM 1.7* 0.7 - 1.3 mg/dL Final   CREATININE, SER  Date/Time Value Ref Range Status  12/25/2015 05:26 AM 2.64* 0.61 - 1.24 mg/dL Final  12/24/2015 02:26 AM 2.60* 0.61 - 1.24 mg/dL Final  12/23/2015 05:22 AM 2.84* 0.61 - 1.24 mg/dL Final  12/16/2015 05:19 PM 2.93* 0.61 - 1.24 mg/dL Final  06/20/2015 09:55 AM 1.71* 0.61 - 1.24 mg/dL Final  05/21/2011 05:52 PM 1.09 0.50 - 1.35 mg/dL Final  02/27/2010 04:15 PM 1.48 0.4 - 1.5 mg/dL Final  12/02/2007 10:02 AM 1.20  Final    Recent Labs Lab 12/17/2015 1719 12/23/15 0522 12/24/15 0226 12/25/15 0526  NA 132* 134* 135 137  K 2.2* 2.3* 3.4* 3.7  CL 100* 104 108 115*  CO2 19* 20* 17* 13*  GLUCOSE 108* 110* 132* 133*  BUN 37* 36*  33* 38*  CREATININE 2.93* 2.84* 2.60* 2.64*  CALCIUM 9.4 8.7* 8.8* 8.6*    Recent Labs Lab 12/05/2015 1719 12/23/15 0522 12/23/15 1004 12/24/15 0226 12/25/15 0526  WBC 5.4 4.6  --  4.5 7.4  HGB 10.1* 9.3*  --  9.2* 9.4*  HCT 28.6* 26.7* 23.7* 26.9* 28.0*  MCV 103.6* 104.3*  --  105.5* 108.9*  PLT 77* 64*  --  66* 85*   Liver Function Tests:  Recent Labs Lab 12/23/15 0522 12/24/15 0226 12/25/15 0526  AST 151* 171* 179*  ALT 54 59 70*  ALKPHOS 162* 174* 191*  BILITOT 39.5* 42.6* 41.7*  PROT 5.3* 5.5* 5.5*  ALBUMIN 1.9* 1.9* 2.1*    Recent Labs Lab  12/01/2015 1719  LIPASE 68*    Recent Labs Lab 12/19/2015 1839 12/23/15 0522 12/24/15 0226  AMMONIA 64* 60* 66*   Cardiac Enzymes:  Recent Labs Lab 12/08/2015 1839 12/23/15 0020 12/23/15 0522 12/23/15 0948  TROPONINI <0.03 <0.03 <0.03 <0.03   Iron Studies: No results for input(s): IRON, TIBC, TRANSFERRIN, FERRITIN in the last 72 hours. PT/INR: _0 (inr:5)  Xrays/Other Studies: ) Results for orders placed or performed during the hospital encounter of 12/26/2015 (from the past 48 hour(s))  Glucose, capillary     Status: None   Collection Time: 12/23/15 11:59 AM  Result Value Ref Range   Glucose-Capillary 86 65 - 99 mg/dL  Calcium / creatinine ratio, urine     Status: None   Collection Time: 12/23/15  3:00 PM  Result Value Ref Range   Calcium, Ur <0.8 Not Estab. mg/dL    Comment: **Verified by repeat analysis**   Calcium/Creat.Ratio <5 0 - 260 mg/g creat    Comment: (NOTE) Performed At: [facility] [place], [place] [id number] Lindon Romp MD [id number]    Creatinine, Urine 151.7 Not Estab. mg/dL  Occult blood card to lab, stool RN will collect     Status: Abnormal   Collection Time: 12/23/15  3:30 PM  Result Value Ref Range   Fecal Occult Bld POSITIVE (A) NEGATIVE  Glucose, capillary     Status: Abnormal   Collection Time: 12/23/15  5:03 PM  Result Value Ref Range   Glucose-Capillary 102 (H) 65 - 99 mg/dL  Glucose, capillary     Status: Abnormal   Collection Time: 12/23/15  8:02 PM  Result Value Ref Range   Glucose-Capillary 131 (H) 65 - 99 mg/dL  Glucose, capillary     Status: Abnormal   Collection Time: 12/23/15 11:39 PM  Result Value Ref Range   Glucose-Capillary 124 (H) 65 - 99 mg/dL  Comprehensive metabolic panel     Status: Abnormal   Collection Time: 12/24/15  2:26 AM  Result Value Ref Range   Sodium 135 135 - 145 mmol/L   Potassium 3.4 (L) 3.5 - 5.1 mmol/L    Comment: DELTA CHECK NOTED   Chloride 108 101 - 111 mmol/L    CO2 17 (L) 22 - 32 mmol/L   Glucose, Bld 132 (H) 65 - 99 mg/dL   BUN 33 (H) 6 - 20 mg/dL   Creatinine, Ser 2.60 (H) 0.61 - 1.24 mg/dL   Calcium 8.8 (L) 8.9 - 10.3 mg/dL   Total Protein 5.5 (L) 6.5 - 8.1 g/dL   Albumin 1.9 (L) 3.5 - 5.0 g/dL   AST 171 (H) 15 - 41 U/L   ALT 59 17 - 63 U/L   Alkaline Phosphatase 174 (H) 38 - 126 U/L   Total Bilirubin  42.6 (HH) 0.3 - 1.2 mg/dL    Comment: RESULTS CONFIRMED BY MANUAL DILUTION CRITICAL VALUE NOTED.  VALUE IS CONSISTENT WITH PREVIOUSLY REPORTED AND CALLED VALUE.    GFR calc non Af Amer 27 (L) >60 mL/min   GFR calc Af Amer 31 (L) >60 mL/min    Comment: (NOTE) The eGFR has been calculated using the CKD EPI equation. This calculation has not been validated in all clinical situations. eGFR's persistently <60 mL/min signify possible Chronic Kidney Disease.    Anion gap 10 5 - 15  CBC     Status: Abnormal   Collection Time: 12/24/15  2:26 AM  Result Value Ref Range   WBC 4.5 4.0 - 10.5 K/uL   RBC 2.55 (L) 4.22 - 5.81 MIL/uL   Hemoglobin 9.2 (L) 13.0 - 17.0 g/dL   HCT 26.9 (L) 39.0 - 52.0 %   MCV 105.5 (H) 78.0 - 100.0 fL   MCH 36.1 (H) 26.0 - 34.0 pg   MCHC 34.2 30.0 - 36.0 g/dL   RDW 16.0 (H) 11.5 - 15.5 %   Platelets 66 (L) 150 - 400 K/uL    Comment: CONSISTENT WITH PREVIOUS RESULT  Ammonia     Status: Abnormal   Collection Time: 12/24/15  2:26 AM  Result Value Ref Range   Ammonia 66 (H) 9 - 35 umol/L  Protime-INR     Status: Abnormal   Collection Time: 12/24/15  2:26 AM  Result Value Ref Range   Prothrombin Time 24.2 (H) 11.6 - 15.2 seconds   INR 2.19 (H) 0.00 - 1.49  Glucose, capillary     Status: Abnormal   Collection Time: 12/24/15  3:40 AM  Result Value Ref Range   Glucose-Capillary 124 (H) 65 - 99 mg/dL  Glucose, capillary     Status: Abnormal   Collection Time: 12/24/15  9:30 AM  Result Value Ref Range   Glucose-Capillary 149 (H) 65 - 99 mg/dL  Glucose, capillary     Status: Abnormal   Collection Time: 12/24/15  12:00 PM  Result Value Ref Range   Glucose-Capillary 127 (H) 65 - 99 mg/dL  Glucose, capillary     Status: Abnormal   Collection Time: 12/24/15  4:00 PM  Result Value Ref Range   Glucose-Capillary 143 (H) 65 - 99 mg/dL  Glucose, capillary     Status: Abnormal   Collection Time: 12/24/15  8:10 PM  Result Value Ref Range   Glucose-Capillary 131 (H) 65 - 99 mg/dL  Glucose, capillary     Status: Abnormal   Collection Time: 12/25/15 12:20 AM  Result Value Ref Range   Glucose-Capillary 124 (H) 65 - 99 mg/dL  Glucose, capillary     Status: Abnormal   Collection Time: 12/25/15  4:45 AM  Result Value Ref Range   Glucose-Capillary 152 (H) 65 - 99 mg/dL   Comment 1 Notify RN    Comment 2 Document in Chart   Comprehensive metabolic panel     Status: Abnormal   Collection Time: 12/25/15  5:26 AM  Result Value Ref Range   Sodium 137 135 - 145 mmol/L   Potassium 3.7 3.5 - 5.1 mmol/L   Chloride 115 (H) 101 - 111 mmol/L   CO2 13 (L) 22 - 32 mmol/L   Glucose, Bld 133 (H) 65 - 99 mg/dL   BUN 38 (H) 6 - 20 mg/dL   Creatinine, Ser 2.64 (H) 0.61 - 1.24 mg/dL   Calcium 8.6 (L) 8.9 - 10.3 mg/dL   Total Protein  5.5 (L) 6.5 - 8.1 g/dL   Albumin 2.1 (L) 3.5 - 5.0 g/dL   AST 179 (H) 15 - 41 U/L   ALT 70 (H) 17 - 63 U/L   Alkaline Phosphatase 191 (H) 38 - 126 U/L   Total Bilirubin 41.7 (HH) 0.3 - 1.2 mg/dL    Comment: RESULTS CONFIRMED BY MANUAL DILUTION CRITICAL VALUE NOTED.  VALUE IS CONSISTENT WITH PREVIOUSLY REPORTED AND CALLED VALUE.    GFR calc non Af Amer 26 (L) >60 mL/min   GFR calc Af Amer 31 (L) >60 mL/min    Comment: (NOTE) The eGFR has been calculated using the CKD EPI equation. This calculation has not been validated in all clinical situations. eGFR's persistently <60 mL/min signify possible Chronic Kidney Disease.    Anion gap 9 5 - 15  CBC     Status: Abnormal   Collection Time: 12/25/15  5:26 AM  Result Value Ref Range   WBC 7.4 4.0 - 10.5 K/uL   RBC 2.57 (L) 4.22 - 5.81  MIL/uL   Hemoglobin 9.4 (L) 13.0 - 17.0 g/dL   HCT 28.0 (L) 39.0 - 52.0 %   MCV 108.9 (H) 78.0 - 100.0 fL   MCH 36.6 (H) 26.0 - 34.0 pg   MCHC 33.6 30.0 - 36.0 g/dL   RDW 16.3 (H) 11.5 - 15.5 %   Platelets 85 (L) 150 - 400 K/uL    Comment: SPECIMEN CHECKED FOR CLOTS REPEATED TO VERIFY CONSISTENT WITH PREVIOUS RESULT   Glucose, capillary     Status: Abnormal   Collection Time: 12/25/15  8:01 AM  Result Value Ref Range   Glucose-Capillary 105 (H) 65 - 99 mg/dL   No results found.  PMH:   Past Medical History  Diagnosis Date  . Hyperlipemia     takes Fish Oil daily  . Arthritis   . Chronic low back pain     stenosis  . Chronic neck pain   . Sleep apnea   . Carpal tunnel syndrome   . Asthma     Albuterol daily as needed  . Anxiety     Valium daily as needed  . GERD (gastroesophageal reflux disease)     takes Nexium daily  . Hypertension     takes Lisinopril-HCTZ daily  . Depression     takes Prozac daily  . Diabetes mellitus     takes Metformin daily  . PONV (postoperative nausea and vomiting)   . Sleep - wake disorder   . Shortness of breath dyspnea     lying too flat  . History of bronchitis 2015  . Dizziness   . Weakness     numbness and tingling-feet  . Multiple bruises     one large one from fall on back of right upper arm and tiny ones on left upper arm  . Sleep walking   . History of hiatal hernia   . History of colon polyps     precancerous  . Urinary urgency   . Nocturia   . Platelets decreased ([place])     per pt "always low"  . Insomnia     doesn't take any meds  . Multiple falls   . Fatty liver disease, nonalcoholic     PSH:   Past Surgical History  Procedure Laterality Date  . Elbow surgery Bilateral   . Neck surgery  2007    fusion  . Back surgery  2009  . Left knuckle surgery      4th  finger  . Wisdom teeth extracted    . Nose reset    . Tonsillectomy    . Colonoscopy    . Esophagogastroduodenoscopy      Allergies:  Allergies   Allergen Reactions  . Ancef [Cefazolin Sodium] Shortness Of Breath and Rash    Burning sensation  . Codeine Hives and Itching  . Iodine Hives  . Iohexol      Code: HIVES, Desc: rash and hives   . Sulfa Drugs Cross Reactors Hives    Medications:   Prior to Admission medications   Medication Sig Start Date End Date Taking? Authorizing Provider  albuterol (PROVENTIL HFA;VENTOLIN HFA) 108 (90 BASE) MCG/ACT inhaler Inhale 2 puffs into the lungs every 6 (six) hours as needed.     Yes Historical Provider, MD  diazepam (VALIUM) 10 MG tablet Take 1 tablet by mouth 3 (three) times daily as needed. 05/15/15  Yes Historical Provider, MD  esomeprazole (NEXIUM) 20 MG capsule Take 20 mg by mouth daily at 12 noon.   Yes Historical Provider, MD  FLUoxetine (PROZAC) 20 MG capsule Take 20 mg by mouth daily.    Yes Historical Provider, MD  gabapentin (NEURONTIN) 600 MG tablet Take 600 mg by mouth 3 (three) times daily.    Yes Historical Provider, MD  lisinopril-hydrochlorothiazide (PRINZIDE,ZESTORETIC) 20-25 MG per tablet Take 1 tablet by mouth daily.     Yes Historical Provider, MD  metFORMIN (GLUCOPHAGE) 850 MG tablet Take 850 mg by mouth 2 (two) times daily with a meal.   Yes Historical Provider, MD  Multiple Vitamins-Minerals (MULTI COMPLETE PO) Take 1 tablet by mouth daily.   Yes Historical Provider, MD  naproxen sodium (ALEVE) 220 MG tablet Take 440 mg by mouth 3 (three) times daily as needed.   Yes Historical Provider, MD  ondansetron (ZOFRAN ODT) 4 MG disintegrating tablet Take 1 tablet (4 mg total) by mouth every 8 (eight) hours as needed for nausea or vomiting. 06/26/15  Yes Carlisle Cater, PA-C    Discontinued Meds:   Medications Discontinued During This Encounter  Medication Reason  . lactulose (CHRONULAC) 10 GM/15ML solution 30 g   . sodium chloride 0.9 % 1,000 mL with potassium chloride 40 mEq infusion   . thiamine 595m in normal saline (536m IVPB   . lactulose (CHRONULAC) 10 GM/15ML  solution 30 g   . sodium chloride 0.9 % 1,000 mL with potassium chloride 40 mEq infusion     Social History:  reports that he has quit smoking. He does not have any smokeless tobacco history on file. He reports that he drinks alcohol. He reports that he uses illicit drugs (Marijuana).  Family History:   Family History  Problem Relation Age of Onset  . Heart disease Father   . Stroke Father   . Bipolar disorder Father   . Pneumonia Father   . Liver disease Brother     Fatty liver    Blood pressure 102/67, pulse 73, temperature 97.6 F (36.4 C), temperature source Oral, resp. rate 13, height 6' 1" (1.854 m), weight 122.471 kg (270 lb), SpO2 100 %. General appearance: alert and cooperative Eyes: sclera icteric Neck: no adenopathy, no carotid bruit, supple, symmetrical, trachea midline and thyroid not enlarged, symmetric, no tenderness/mass/nodules Back: symmetric, no curvature. ROM normal. No CVA tenderness. Resp: poor air movement but no rales Chest wall: no tenderness Cardio: regular rate and rhythm, S1, S2 normal, no murmur, click, rub or gallop GI: decreased BS, tympanic Extremities: 1+ edema in the  lower ext b/l  Pulses: 2+ and symmetric Skin: Skin color, texture, turgor normal. No rashes or lesions Lymph nodes: Cervical, supraclavicular, and axillary nodes normal. Neurologic: Mental status: cooperative but slow mentation       Isidor Bromell, Hunt Oris, MD 12/25/2015, 10:19 AM

## 2015-12-26 ENCOUNTER — Inpatient Hospital Stay (HOSPITAL_COMMUNITY): Payer: 59

## 2015-12-26 DIAGNOSIS — N183 Chronic kidney disease, stage 3 (moderate): Secondary | ICD-10-CM

## 2015-12-26 DIAGNOSIS — I6200 Nontraumatic subdural hemorrhage, unspecified: Secondary | ICD-10-CM

## 2015-12-26 DIAGNOSIS — J9601 Acute respiratory failure with hypoxia: Secondary | ICD-10-CM

## 2015-12-26 DIAGNOSIS — Z515 Encounter for palliative care: Secondary | ICD-10-CM

## 2015-12-26 DIAGNOSIS — G9340 Encephalopathy, unspecified: Secondary | ICD-10-CM

## 2015-12-26 DIAGNOSIS — Z7189 Other specified counseling: Secondary | ICD-10-CM

## 2015-12-26 DIAGNOSIS — S065X9A Traumatic subdural hemorrhage with loss of consciousness of unspecified duration, initial encounter: Secondary | ICD-10-CM | POA: Insufficient documentation

## 2015-12-26 DIAGNOSIS — S065XAA Traumatic subdural hemorrhage with loss of consciousness status unknown, initial encounter: Secondary | ICD-10-CM | POA: Insufficient documentation

## 2015-12-26 DIAGNOSIS — R569 Unspecified convulsions: Secondary | ICD-10-CM

## 2015-12-26 LAB — HEPATITIS C ANTIBODY: HCV Ab: <0.1 s/co ratio (ref 0.0–0.9)

## 2015-12-26 LAB — COMPREHENSIVE METABOLIC PANEL
ALBUMIN: 2.2 g/dL — AB (ref 3.5–5.0)
ALK PHOS: 203 U/L — AB (ref 38–126)
ALT: 84 U/L — ABNORMAL HIGH (ref 17–63)
AST: 181 U/L — AB (ref 15–41)
Anion gap: 14 (ref 5–15)
BILIRUBIN TOTAL: 45 mg/dL — AB (ref 0.3–1.2)
BUN: 48 mg/dL — AB (ref 6–20)
CO2: 12 mmol/L — ABNORMAL LOW (ref 22–32)
Calcium: 9.3 mg/dL (ref 8.9–10.3)
Chloride: 115 mmol/L — ABNORMAL HIGH (ref 101–111)
Creatinine, Ser: 2.8 mg/dL — ABNORMAL HIGH (ref 0.61–1.24)
GFR calc Af Amer: 28 mL/min — ABNORMAL LOW (ref >60)
GFR, EST NON AFRICAN AMERICAN: 25 mL/min — AB (ref >60)
GLUCOSE: 151 mg/dL — AB (ref 65–99)
POTASSIUM: 3.7 mmol/L (ref 3.5–5.1)
Sodium: 141 mmol/L (ref 135–145)
Total Protein: 6 g/dL — ABNORMAL LOW (ref 6.5–8.1)

## 2015-12-26 LAB — PROTIME-INR
INR: 1.83 — ABNORMAL HIGH (ref 0.00–1.49)
PROTHROMBIN TIME: 21.1 s — AB (ref 11.6–15.2)

## 2015-12-26 LAB — CBC
HEMATOCRIT: 30.1 % — AB (ref 39.0–52.0)
HEMOGLOBIN: 10.1 g/dL — AB (ref 13.0–17.0)
MCH: 37.8 pg — AB (ref 26.0–34.0)
MCHC: 33.6 g/dL (ref 30.0–36.0)
MCV: 112.7 fL — AB (ref 78.0–100.0)
Platelets: 138 10*3/uL — ABNORMAL LOW (ref 150–400)
RBC: 2.67 MIL/uL — ABNORMAL LOW (ref 4.22–5.81)
RDW: 16.7 % — ABNORMAL HIGH (ref 11.5–15.5)
WBC: 16 10*3/uL — ABNORMAL HIGH (ref 4.0–10.5)

## 2015-12-26 LAB — AMMONIA: Ammonia: 112 umol/L — ABNORMAL HIGH (ref 9–35)

## 2015-12-26 LAB — GLUCOSE, CAPILLARY
GLUCOSE-CAPILLARY: 177 mg/dL — AB (ref 65–99)
GLUCOSE-CAPILLARY: 121 mg/dL — AB (ref 65–99)
Glucose-Capillary: 124 mg/dL — ABNORMAL HIGH (ref 65–99)
Glucose-Capillary: 130 mg/dL — ABNORMAL HIGH (ref 65–99)
Glucose-Capillary: 144 mg/dL — ABNORMAL HIGH (ref 65–99)

## 2015-12-26 MED ORDER — ACETAMINOPHEN 325 MG PO TABS: 650.0000 mg | ORAL_TABLET | Freq: Four times a day (QID) | ORAL | Status: DC | PRN

## 2015-12-26 MED ORDER — ONDANSETRON 4 MG PO TBDP
4.0000 mg | ORAL_TABLET | Freq: Four times a day (QID) | ORAL | Status: DC | PRN
  Filled 2015-12-26: qty 1

## 2015-12-26 MED ORDER — LORAZEPAM 2 MG/ML IJ SOLN
1.0000 mg | Freq: Once | INTRAMUSCULAR | Status: AC
  Administered 2015-12-26: 1 mg via INTRAVENOUS

## 2015-12-26 MED ORDER — GLYCOPYRROLATE 0.2 MG/ML IJ SOLN
0.2000 mg | INTRAMUSCULAR | Status: DC | PRN
  Administered 2015-12-26 – 2015-12-29 (×10): 0.2 mg via INTRAVENOUS
  Filled 2015-12-26 (×11): qty 1

## 2015-12-26 MED ORDER — GLYCOPYRROLATE 1 MG PO TABS
1.0000 mg | ORAL_TABLET | ORAL | Status: DC | PRN
  Filled 2015-12-26: qty 1

## 2015-12-26 MED ORDER — FENTANYL CITRATE (PF) 100 MCG/2ML IJ SOLN
25.0000 ug | INTRAMUSCULAR | Status: DC | PRN
  Administered 2015-12-26 – 2015-12-27 (×8): 25 ug via INTRAVENOUS
  Filled 2015-12-26 (×8): qty 2

## 2015-12-26 MED ORDER — POLYVINYL ALCOHOL 1.4 % OP SOLN
1.0000 [drp] | Freq: Four times a day (QID) | OPHTHALMIC | Status: DC | PRN
  Filled 2015-12-26: qty 15

## 2015-12-26 MED ORDER — LORAZEPAM 2 MG/ML IJ SOLN
INTRAMUSCULAR | Status: AC
  Filled 2015-12-26: qty 1

## 2015-12-26 MED ORDER — ACETAMINOPHEN 650 MG RE SUPP: 650.0000 mg | Freq: Four times a day (QID) | RECTAL | Status: DC | PRN

## 2015-12-26 MED ORDER — LORAZEPAM 2 MG/ML IJ SOLN
1.0000 mg | Freq: Four times a day (QID) | INTRAMUSCULAR | Status: DC | PRN
  Administered 2015-12-26 – 2015-12-28 (×2): 1 mg via INTRAVENOUS
  Filled 2015-12-26 (×3): qty 1

## 2015-12-26 MED ORDER — LEVETIRACETAM 500 MG/5ML IV SOLN
500.0000 mg | Freq: Two times a day (BID) | INTRAVENOUS | Status: DC
  Administered 2015-12-26: 500 mg via INTRAVENOUS
  Filled 2015-12-26 (×4): qty 5

## 2015-12-26 MED ORDER — OCTREOTIDE ACETATE 100 MCG/ML IJ SOLN
100.0000 ug | Freq: Two times a day (BID) | INTRAMUSCULAR | Status: DC
  Administered 2015-12-26: 100 ug via SUBCUTANEOUS
  Filled 2015-12-26 (×2): qty 1

## 2015-12-26 MED ORDER — SODIUM CHLORIDE 0.9 % IV SOLN
INTRAVENOUS | Status: DC
  Administered 2015-12-26: 10:00:00 via INTRAVENOUS

## 2015-12-26 MED ORDER — GLYCOPYRROLATE 0.2 MG/ML IJ SOLN
0.2000 mg | INTRAMUSCULAR | Status: DC | PRN
  Filled 2015-12-26: qty 1

## 2015-12-26 MED ORDER — ONDANSETRON HCL 4 MG/2ML IJ SOLN: 4.0000 mg | Freq: Four times a day (QID) | INTRAMUSCULAR | Status: DC | PRN

## 2015-12-26 MED ORDER — SODIUM CHLORIDE 0.9 % IV SOLN
1000.0000 mg | Freq: Once | INTRAVENOUS | Status: AC
  Administered 2015-12-26: 1000 mg via INTRAVENOUS
  Filled 2015-12-26 (×2): qty 10

## 2015-12-26 MED ORDER — BIOTENE DRY MOUTH MT LIQD: 15.0000 mL | OROMUCOSAL | Status: DC | PRN

## 2015-12-26 MED ORDER — POTASSIUM CL IN DEXTROSE 5% 20 MEQ/L IV SOLN
20.0000 meq | INTRAVENOUS | Status: DC
  Administered 2015-12-26: 20 meq via INTRAVENOUS
  Filled 2015-12-26 (×3): qty 1000

## 2015-12-26 NOTE — Progress Notes (Signed)
[facility] Progress Note    Assessment/ Plan:   1. Acute Kidney Injury - Patient has a history of CKD IIIA as well with a B/L cr ~1.7. He may have an element of prerenal azotemia but cannot r/o HRS. He had initial improvement in the creatinine from time of presentation after saline was given but with the lactulose he has required a rectal tube and currently cannot r/o prerenal azotemia. Obstruction is less likely with a foley in place and no reported H/o obstruction. Creatinine in Mar or April would have been very helpful as this may be HRSII. Lisinopril/ HCTZ discontinued. - Urine microscopy --> not impressive (a few pigmented casts, ~3 WBC/hpf, no dysmorphic RBC's. Not suggestive of ATN - Will check a UAG --> nongap acidosis may be from the diarrhea --> but currently has gap + nongap acidosis c/w worsening renal function. Patient most likely has HRS1 vs HRS2 and a creatinine in the Mar or April time period would have been helpful to differentiate. - Will change from 1/2 NS + 1.5 amps of HCO3 @75ml /hr to D5W + 3amps - He has a high MELD score and I don't believe he would benefit from RRT if it became necessary. 2. Hepatic encephalopathy - being treated with Lactulose with mild improvement in MS. 3. Cirrhosis - seen by GI. MELD score of 37. 4. DM - Metformin already D/C by primary team 5. Hyponatremia - mild and secondary to the decreased effective arterial circulating volume. 6. Diarrhea - lactulose titrated down  Subjective:   Seizures this AM. Neurology consulted.  UOP remains good and vitals stable.   Objective:   BP 123/59 mmHg  Pulse 96  Temp(Src) 98.6 F (37 C) (Oral)  Resp 25  Ht 6\' 1"  (1.854 m)  Wt 122.471 kg (270 lb)  BMI 35.63 kg/m2  SpO2 98%  Intake/Output Summary (Last 24 hours) at 12/26/15 [id number] Last data filed at 12/26/15 0600  Gross per 24 hour  Intake 2076.75 ml  Output   1400 ml  Net 676.75 ml   Weight change:   Physical Exam: General  appearance: not following commands Eyes: sclera icteric Resp: poor air movement but no rales Chest wall: no tenderness Cardio: regular rate and rhythm, S1, S2 normal, no murmur, click, rub or gallop GI: decreased BS, tympanic Extremities: 1+ edema in the lower ext b/l  Pulses: 2+ and symmetric Neurologic: Mental status: slow mentation  Imaging: No results found.  Labs: BMET  Recent Labs Lab 12/02/2015 1719 12/23/15 0522 12/24/15 0226 12/25/15 0526 12/26/15 0448  NA 132* 134* 135 137 141  K 2.2* 2.3* 3.4* 3.7 3.7  CL 100* 104 108 115* 115*  CO2 19* 20* 17* 13* 12*  GLUCOSE 108* 110* 132* 133* 151*  BUN 37* 36* 33* 38* 48*  CREATININE 2.93* 2.84* 2.60* 2.64* 2.80*  CALCIUM 9.4 8.7* 8.8* 8.6* 9.3   CBC  Recent Labs Lab 12/23/15 0522 12/23/15 1004 12/24/15 0226 12/25/15 0526 12/26/15 0448  WBC 4.6  --  4.5 7.4 16.0*  HGB 9.3*  --  9.2* 9.4* 10.1*  HCT 26.7* 23.7* 26.9* 28.0* 30.1*  MCV 104.3*  --  105.5* 108.9* 112.7*  PLT 64*  --  66* 85* 138*    Medications:    . FLUoxetine  20 mg Oral Daily  . insulin aspart  0-9 Units Subcutaneous TID WC  . lactulose  20 g Oral BID  . LORazepam      . metroNIDAZOLE  500 mg Oral Q8H  .  octreotide  100 mcg Subcutaneous Q12H  . pantoprazole  40 mg Oral BID  . prednisoLONE  40 mg Oral Daily  . rifaximin  550 mg Oral BID  . sodium chloride flush  3 mL Intravenous Q12H  . thiamine IV  500 mg Intravenous Daily      Paulene FloorJames Daved Mcfann, MD 12/26/2015, 8:07 AM

## 2015-12-26 NOTE — Progress Notes (Signed)
EEG completed bedside full report to follow. °

## 2015-12-26 NOTE — Progress Notes (Signed)
Daily Progress Note   Patient Name: [name]       Date: 12/26/2015 DOB: 1965-09-03  Age: 50 y.o. MRN#: [id number] Attending Physician: Elease EtienneAnand D Hongalgi, MD Primary Care Physician: [name],[name], MD Admit Date: 12/23/2015  Reason for Consultation/Follow-up: Terminal Care  Subjective:  overnight events noted.  Patient with seizures, CT head noted with subdural and midline shift Not responsive See below   Length of Stay: 4  Current Medications: Scheduled Meds:  . FLUoxetine  20 mg Oral Daily  . insulin aspart  0-9 Units Subcutaneous TID WC  . lactulose  20 g Oral BID  . levETIRAcetam  1,000 mg Intravenous Once  . levETIRAcetam  500 mg Intravenous Q12H  . metroNIDAZOLE  500 mg Oral Q8H  . octreotide  100 mcg Subcutaneous Q12H  . pantoprazole  40 mg Oral BID  . prednisoLONE  40 mg Oral Daily  . rifaximin  550 mg Oral BID  . sodium chloride flush  3 mL Intravenous Q12H  . thiamine IV  500 mg Intravenous Daily    Continuous Infusions: . sodium chloride    . dextrose 5 % with KCl 20 mEq / L 20 mEq (12/26/15 0946)    PRN Meds: acetaminophen **OR** acetaminophen, antiseptic oral rinse, fentaNYL (SUBLIMAZE) injection, glycopyrrolate **OR** glycopyrrolate **OR** glycopyrrolate, LORazepam, ondansetron **OR** ondansetron (ZOFRAN) IV, polyvinyl alcohol  Physical Exam         Coarse breath sounds S1 S2 Abdomen distended Edema Non responsive Icteric  Vital Signs: BP 122/59 mmHg  Pulse 91  Temp(Src) 98.6 F (37 C) (Oral)  Resp 20  Ht 6\' 1"  (1.854 m)  Wt 122.471 kg (270 lb)  BMI 35.63 kg/m2  SpO2 99% SpO2: SpO2: 99 % O2 Device: O2 Device: Nasal Cannula O2 Flow Rate: O2 Flow Rate (L/min): 5 L/min  Intake/output summary:  Intake/Output Summary (Last 24 hours) at  12/26/15 1032 Last data filed at 12/26/15 0800  Gross per 24 hour  Intake 1863.75 ml  Output   1400 ml  Net 463.75 ml   LBM: Last BM Date: 12/25/15 Baseline Weight: Weight: 122.471 kg (270 lb) Most recent weight: Weight:  (bed needs to be zeroed out)       Palliative Assessment/Data:    Flowsheet Rows        Most Recent Value  Intake Tab    Referral Department  Hospitalist   Unit at Time of Referral  Intermediate Care Unit   Palliative Care Primary Diagnosis  Neurology   Date Notified  12/25/15   Palliative Care Type  Return patient Palliative Care   Reason for referral  End of Life Care Assistance   Date of Admission  12/09/2015   Date first seen by Palliative Care  12/25/15   # of days Palliative referral response time  0 Day(s)   # of days IP prior to Palliative referral  3   Clinical Assessment    Palliative Performance Scale Score  20%   Psychosocial & Spiritual Assessment    Palliative Care Outcomes    Patient/Family meeting held?  Yes   Who was at the meeting?  wife daughter    Palliative Care Outcomes  Clarified goals of care, Counseled regarding hospice, Provided end of life care assistance   Patient/Family wishes: Interventions discontinued/not started   Mechanical Ventilation, BiPAP, Tube feedings/TPN, Trach, PEG      Patient Active Problem List   Diagnosis Date Noted  . Advance care planning   . Goals of care, counseling/discussion   . Palliative care encounter   . Acute renal failure superimposed on stage 3 chronic kidney disease (HCC) 12/23/2015  . Encephalopathy, hepatic (HCC) 12/23/2015  . Decompensated hepatic cirrhosis (HCC) 12/23/2015  . Abnormal liver function 12/11/2015  . Acute renal failure (ARF) (HCC) 12/19/2015  . Hypokalemia 12/03/2015  . Anemia 12/11/2015  . Hyponatremia 12/16/2015  . Thrombocytopenia (HCC) 12/14/2015  . Acute liver failure 12/13/2015  . Altered mental status 12/12/2015  . Acute encephalopathy   . Severe obesity (BMI  >= 40) (HCC) 02/28/2015  . Narcotic dependence (HCC) 02/28/2015  . Chronic pain syndrome 02/28/2015  . Failed back syndrome, lumbar 02/28/2015  . Sleep apnea 02/28/2015    Palliative Care Assessment & Plan   Patient Profile:    Assessment:  seizures Subdural brain bleed with midline shift Hepatic encephalopathy Liver failure Kidney failure Cirrhosis.   Recommendations/Plan:  Comfort measures only, will complete end of life order set, consider D/C routine blood work, consider D/C routine imaging, may transfer to 6N, prognosis hours to days and possibility of transfer to 6N discussed with family- wife and daughter who are in complete agreement with comfort care. Do not desire neuro surgical consultation. Want comfort and dignity at end of life.    Goals of Care and Additional Recommendations:  Limitations on Scope of Treatment: Full Comfort Care  Code Status:    Code Status Orders        Start     Ordered   12/26/15 1031  Do not attempt resuscitation (DNR)   Continuous    Question Answer Comment  In the event of cardiac or respiratory ARREST Do not call a "code blue"   In the event of cardiac or respiratory ARREST Do not perform Intubation, CPR, defibrillation or ACLS   In the event of cardiac or respiratory ARREST Use medication by any route, position, wound care, and other measures to relive pain and suffering. May use oxygen, suction and manual treatment of airway obstruction as needed for comfort.      12/26/15 1031    Code Status History    Date Active Date Inactive Code Status Order ID Comments User Context   12/24/2015 10:40 PM 12/26/2015 10:31 AM DNR [id number]  Pearson Grippe, MD ED       Prognosis:   Hours - Days  Discharge Planning:  Anticipated Hospital Death  Care plan was discussed with  Wife daughter, Dr Waymon AmatoHongalgi, neurology team, CCM team.   Thank you for allowing the Palliative Medicine Team to assist in the care of this patient.   Time In: 9:55  Time Out: 1030 Total Time 35 Prolonged Time Billed  no       Greater than 50%  of this time was spent counseling and coordinating care related to the above assessment and plan.  Rosalin HawkingZeba Kyrollos Cordell, MD [phone]  [phone]  Please contact Palliative Medicine Team phone at [phone] for questions and concerns.

## 2015-12-26 NOTE — Consult Note (Signed)
Name: [name] MRN: [id number] DOB: 22-Apr-1966    ADMISSION DATE:  12/21/2015 CONSULTATION DATE:  6/28  REFERRING MD :  [facility] (Hongalgi)  CHIEF COMPLAINT:  Seizures, respiratory failure   BRIEF PATIENT DESCRIPTION: 50 yo male with hx NASH cirrhosis, CKD, DM, HTN admitted 6/25 with lethargy, worsening encephalopathy r/t decompensated hepatic failure and AKI.  He was followed closely by GI and renal but continued to have worsening hepatic and renal failure.  Family met with palliative care 6/28 and made pt DNR with plans for transition to comfort if he declined.  On 6/29 PCCM was called urgently to bedside for possible intubation r/t multiple new seizures now known to be r/t new SDH.     SIGNIFICANT EVENTS    STUDIES:  CT head 6/29>> large R SDH with 52m R->L midline shift.     HISTORY OF PRESENT ILLNESS:  50yo male with hx NASH cirrhosis, CKD, DM, HTN admitted 6/25 with lethargy, worsening encephalopathy r/t decompensated hepatic failure and AKI.  He was followed closely by GI and renal but continued to have worsening hepatic and renal failure.  Family met with palliative care 6/28 and made pt DNR with plans for transition to comfort if he declined.  On 6/29 PCCM was called urgently to bedside for possible intubation r/t multiple new seizures now known to be r/t new SDH.      PAST MEDICAL HISTORY :   has a past medical history of Hyperlipemia; Arthritis; Chronic low back pain; Chronic neck pain; Sleep apnea; Carpal tunnel syndrome; Asthma; Anxiety; GERD (gastroesophageal reflux disease); Hypertension; Depression; Diabetes mellitus; PONV (postoperative nausea and vomiting); Sleep - wake disorder; Shortness of breath dyspnea; History of bronchitis (2015); Dizziness; Weakness; Multiple bruises; Sleep walking; History of hiatal hernia; History of colon polyps; Urinary urgency; Nocturia; Platelets decreased ([place]; Insomnia; Multiple falls; and Fatty liver disease, nonalcoholic.  has  past surgical history that includes Elbow surgery (Bilateral); Neck surgery (2007); Back surgery (2009); left knuckle surgery; wisdom teeth extracted; nose reset; Tonsillectomy; Colonoscopy; and Esophagogastroduodenoscopy. Prior to Admission medications   Medication Sig Start Date End Date Taking? Authorizing Provider  albuterol (PROVENTIL HFA;VENTOLIN HFA) 108 (90 BASE) MCG/ACT inhaler Inhale 2 puffs into the lungs every 6 (six) hours as needed.     Yes Historical Provider, MD  diazepam (VALIUM) 10 MG tablet Take 1 tablet by mouth 3 (three) times daily as needed. 05/15/15  Yes Historical Provider, MD  esomeprazole (NEXIUM) 20 MG capsule Take 20 mg by mouth daily at 12 noon.   Yes Historical Provider, MD  FLUoxetine (PROZAC) 20 MG capsule Take 20 mg by mouth daily.    Yes Historical Provider, MD  gabapentin (NEURONTIN) 600 MG tablet Take 600 mg by mouth 3 (three) times daily.    Yes Historical Provider, MD  lisinopril-hydrochlorothiazide (PRINZIDE,ZESTORETIC) 20-25 MG per tablet Take 1 tablet by mouth daily.     Yes Historical Provider, MD  metFORMIN (GLUCOPHAGE) 850 MG tablet Take 850 mg by mouth 2 (two) times daily with a meal.   Yes Historical Provider, MD  Multiple Vitamins-Minerals (MULTI COMPLETE PO) Take 1 tablet by mouth daily.   Yes Historical Provider, MD  naproxen sodium (ALEVE) 220 MG tablet Take 440 mg by mouth 3 (three) times daily as needed.   Yes Historical Provider, MD  ondansetron (ZOFRAN ODT) 4 MG disintegrating tablet Take 1 tablet (4 mg total) by mouth every 8 (eight) hours as needed for nausea or vomiting. 06/26/15  Yes JCarlisle Cater PA-C  Allergies  Allergen Reactions  . Ancef [Cefazolin Sodium] Shortness Of Breath and Rash    Burning sensation  . Codeine Hives and Itching  . Iodine Hives  . Iohexol      Code: HIVES, Desc: rash and hives   . Sulfa Drugs Cross Reactors Hives    FAMILY HISTORY:  family history includes Bipolar disorder in his father; Heart disease in  his father; Liver disease in his brother; Pneumonia in his father; Stroke in his father. SOCIAL HISTORY:  reports that he has quit smoking. He does not have any smokeless tobacco history on file. He reports that he drinks alcohol. He reports that he uses illicit drugs (Marijuana).  REVIEW OF SYSTEMS:   Unable, pt obtunded.    SUBJECTIVE:   VITAL SIGNS: Temp:  [97.6 F (36.4 C)-99.1 F (37.3 C)] 98.6 F (37 C) (06/29 0600) Pulse Rate:  [67-96] 87 (06/29 1000) Resp:  [16-25] 21 (06/29 1000) BP: (122-157)/(57-75) 134/71 mmHg (06/29 1000) SpO2:  [98 %-100 %] 100 % (06/29 1000)  PHYSICAL EXAMINATION: General:  Chronically ill appearing male, obtunded, significant jaundice  Neuro:  Obtunded, no response, no obvious seizure activity noted  HEENT:  Mm dry, no JVD  Cardiovascular:  s1s2 rrr Lungs:  resps even non labored, diminished bases, scattered rhonchi  Abdomen:  Distended, -bs  Musculoskeletal:  Jaundiced, anasarca    Recent Labs Lab 12/24/15 0226 12/25/15 0526 12/26/15 0448  NA 135 137 141  K 3.4* 3.7 3.7  CL 108 115* 115*  CO2 17* 13* 12*  BUN 33* 38* 48*  CREATININE 2.60* 2.64* 2.80*  GLUCOSE 132* 133* 151*    Recent Labs Lab 12/24/15 0226 12/25/15 0526 12/26/15 0448  HGB 9.2* 9.4* 10.1*  HCT 26.9* 28.0* 30.1*  WBC 4.5 7.4 16.0*  PLT 66* 85* 138*   Ct Head Wo Contrast  12/26/2015  ADDENDUM REPORT: 12/26/2015 10:16 ADDENDUM: Critical Value/emergent results were called by telephone on 12/26/2015 at 10:16 am to Dr. Vernell Leep , who verbally acknowledged these results. Electronically Signed   By: Marijo Conception, M.D.   On: 12/26/2015 10:16  12/26/2015  CLINICAL DATA:  Seizures. EXAM: CT HEAD WITHOUT CONTRAST TECHNIQUE: Contiguous axial images were obtained from the base of the skull through the vertex without intravenous contrast. COMPARISON:  CT scan of December 22, 2015. FINDINGS: Bony calvarium appears intact. There is noted large subdural hematoma overlying the  right cerebral cortex containing high density material consistent with acute hemorrhage. Also noted is subdural hematoma in the right tentorial and parafalcine regions. This results in 12 mm of right to left midline shift. The maximum thickness of this hematoma is 16 mm posteriorly. Ventricular size is within normal limits. No acute infarction is noted. IMPRESSION: Large right subdural hematoma is noted resulting in 12 mm of right to left midline shift. Critical Value/emergent results were called by telephone at the time of interpretation on 12/26/2015 at 10:05 am to [facility], the patient's nurse, who verbally acknowledged these results and will contact the referring physician. My attempts so far to contact this physician were unsuccessful. Electronically Signed: By: Marijo Conception, M.D. On: 12/26/2015 10:08   Dg Chest Port 1 View  12/26/2015  CLINICAL DATA:  Known hepatic and renal failure with difficulty breathing EXAM: PORTABLE CHEST 1 VIEW COMPARISON:  02/27/2010 FINDINGS: Cardiac shadow is mildly enlarged but accentuated by the portable technique. The mediastinum is somewhat widened also likely related to the portable technique and patient rotation to the right. Postsurgical  changes in the cervical spine are seen. The lungs demonstrate no focal infiltrate or sizable effusion. No acute bony abnormality is noted. IMPRESSION: No definitive acute abnormality is seen. Electronically Signed   By: Inez Catalina M.D.   On: 12/26/2015 08:39    ASSESSMENT / PLAN:  Acute respiratory failure -- r/t AMS, seizures/SDH  PLAN -  DNR/DNI  Supportive care  Supplemental O2 if needed for comfort  Recommend PRN morphine for s/s pain or dyspnea  See discussion below    Large SDH  Seizures  Hepatic encephalopathy  PLAN -  Neuro following  Continue AED's per neuro EEG pending although unclear that this will change course/outcome   Neurosurgical intervention would NOT be appropriate    Hepatic failure  AKI  DM    PLAN -  Per primary     Discussed with family at bedside.  They met yesterday with palliative care to discuss goals of care.  The outcome of that meeting was DNR with intentions to transition to comfort care if pt declined.  They had also discussed residential hospice options.   They are appropriately sad and tearful at pt's acute change but feel as though this has been the obvious decline they were monitoring for and are at peace with wanting no further aggressive interventions.  NO intubation and focus on comfort.  Palliative care MD arriving at bedside for further discussions and symptom management.   PCCM will sign off, please call back if needed.    Cc time 30 mins   Nickolas Madrid, NP 12/26/2015  10:43 AM Pager: [phone] or [phone]

## 2015-12-26 NOTE — Procedures (Signed)
HPI:  50 y/o with hepatic encephalopathy, multiples seizures  TECHNICAL SUMMARY:  A multichannel referential and bipolar montage EEG using the standard international 10-20 system was performed on the patient described as unresponsive.  There is no occipital dominant rhythm, as the recording begins and ends in stage II sleep.  There is decreased amplitude and decreased numbers of K complexes in the right hemisphere compared to that of the left.    ACTIVATION:  Stepwise photic stimulation and hyperventilation are not performed  EPILEPTIFORM ACTIVITY:  At approximately 10:57 AM, a rhythmic buildup begins in the right hemisphere.  The patient's left arm begins to elevate clinically.  Generalized spike and wave activity is evident by 10:59 AM, and then the recording becomes artifactual, due to myogenic artifact.  Electrographically, the event is completed by 11:01 AM.  The background following this is very flat.  SLEEP:  Stage II sleep was noted.  CARDIAC:  The EKG lead revealed a regular sinus rhythm.  IMPRESSION:  This is a markedly abnormal EEG demonstrating:  1.  Decreased amplitude and decreased sleep spindles in the right amplitude compared to that of the left.  A structural lesion should be ruled out.  2.  An electrographic seizure that began in the right hemisphere at 10:57 AM and generalized by 10:59 AM.  The event was completed electrographically by 11:01 AM (the patient was given Ativan during the recording for this)  3.  No waking background activity

## 2015-12-26 NOTE — Progress Notes (Signed)
Pt had another seizure during CT scan. This RN at bedside and initiated seizures precautions. Twitching of eyes and lips with arm jerks. Lasted approximately one minute. Pt back in room at this time. Unresponsive to all stimuli. VSS. AM nurse updated.  M.Yalissa Fink, RN

## 2015-12-26 NOTE — Consult Note (Signed)
NEURO HOSPITALIST CONSULT NOTE   Requestig physician: Dr. Waymon AmatoHongalgi   Reason for Consult: seizure   History obtained from:  Chart     HPI:                                                                                                                                          [name] is an 50 y.o. male brought to the hospital after family noted he was jaundice, AMS and hallucinating. patient was diagnosed with acute hepatic encephalopathy, decompensated hepatic failure, and acute kidney disease. Calculated MELD score today of 39 indicates very poor prognosis with 3 month mortality at approximately 52.6%. Palliative care had spoken with family and they were in agree ment with comfort care and hospice. Today he had a seizure and three further seizures in CT. On consultation he was post ictal and CT review showed large Right SDH. Family decided on full comfort care and no progression of care.   Past Medical History  Diagnosis Date  . Hyperlipemia     takes Fish Oil daily  . Arthritis   . Chronic low back pain     stenosis  . Chronic neck pain   . Sleep apnea   . Carpal tunnel syndrome   . Asthma     Albuterol daily as needed  . Anxiety     Valium daily as needed  . GERD (gastroesophageal reflux disease)     takes Nexium daily  . Hypertension     takes Lisinopril-HCTZ daily  . Depression     takes Prozac daily  . Diabetes mellitus     takes Metformin daily  . PONV (postoperative nausea and vomiting)   . Sleep - wake disorder   . Shortness of breath dyspnea     lying too flat  . History of bronchitis 2015  . Dizziness   . Weakness     numbness and tingling-feet  . Multiple bruises     one large one from fall on back of right upper arm and tiny ones on left upper arm  . Sleep walking   . History of hiatal hernia   . History of colon polyps     precancerous  . Urinary urgency   . Nocturia   . Platelets decreased (HCC)     per pt "always  low"  . Insomnia     doesn't take any meds  . Multiple falls   . Fatty liver disease, nonalcoholic     Past Surgical History  Procedure Laterality Date  . Elbow surgery Bilateral   . Neck surgery  2007    fusion  . Back surgery  2009  . Left knuckle surgery      4th finger  . Wisdom teeth extracted    .  Nose reset    . Tonsillectomy    . Colonoscopy    . Esophagogastroduodenoscopy      Family History  Problem Relation Age of Onset  . Heart disease Father   . Stroke Father   . Bipolar disorder Father   . Pneumonia Father   . Liver disease Brother     Fatty liver     Social History:  reports that he has quit smoking. He does not have any smokeless tobacco history on file. He reports that he drinks alcohol. He reports that he uses illicit drugs (Marijuana).  Allergies  Allergen Reactions  . Ancef [Cefazolin Sodium] Shortness Of Breath and Rash    Burning sensation  . Codeine Hives and Itching  . Iodine Hives  . Iohexol      Code: HIVES, Desc: rash and hives   . Sulfa Drugs Cross Reactors Hives    MEDICATIONS:                                                                                                                     Prior to Admission:  Prescriptions prior to admission  Medication Sig Dispense Refill Last Dose  . albuterol (PROVENTIL HFA;VENTOLIN HFA) 108 (90 BASE) MCG/ACT inhaler Inhale 2 puffs into the lungs every 6 (six) hours as needed.     12/21/2015 at Unknown time  . diazepam (VALIUM) 10 MG tablet Take 1 tablet by mouth 3 (three) times daily as needed.   12/27/2015 at Unknown time  . esomeprazole (NEXIUM) 20 MG capsule Take 20 mg by mouth daily at 12 noon.   12/25/2015 at Unknown time  . FLUoxetine (PROZAC) 20 MG capsule Take 20 mg by mouth daily.    12/27/2015 at Unknown time  . gabapentin (NEURONTIN) 600 MG tablet Take 600 mg by mouth 3 (three) times daily.    12/02/2015 at Unknown time  . lisinopril-hydrochlorothiazide (PRINZIDE,ZESTORETIC) 20-25 MG  per tablet Take 1 tablet by mouth daily.     12/18/2015 at Unknown time  . metFORMIN (GLUCOPHAGE) 850 MG tablet Take 850 mg by mouth 2 (two) times daily with a meal.   12/20/2015 at Unknown time  . Multiple Vitamins-Minerals (MULTI COMPLETE PO) Take 1 tablet by mouth daily.   11/30/2015 at Unknown time  . naproxen sodium (ALEVE) 220 MG tablet Take 440 mg by mouth 3 (three) times daily as needed.   12/04/2015 at Unknown time  . ondansetron (ZOFRAN ODT) 4 MG disintegrating tablet Take 1 tablet (4 mg total) by mouth every 8 (eight) hours as needed for nausea or vomiting. 10 tablet 0 Past Week at Unknown time   Scheduled: . FLUoxetine  20 mg Oral Daily  . insulin aspart  0-9 Units Subcutaneous TID WC  . lactulose  20 g Oral BID  . levETIRAcetam  1,000 mg Intravenous Once  . levETIRAcetam  500 mg Intravenous Q12H  . metroNIDAZOLE  500 mg Oral Q8H  . octreotide  100 mcg Subcutaneous Q12H  . pantoprazole  40 mg  Oral BID  . prednisoLONE  40 mg Oral Daily  . rifaximin  550 mg Oral BID  . sodium chloride flush  3 mL Intravenous Q12H  . thiamine IV  500 mg Intravenous Daily     ROS:                                                                                                                                       History obtained from family  General ROS: negative for - chills, fatigue, fever, night sweats, weight gain or weight loss Psychological ROS: negative for - behavioral disorder, hallucinations, memory difficulties, mood swings or suicidal ideation Ophthalmic ROS: negative for - blurry vision, double vision, eye pain or loss of vision ENT ROS: negative for - epistaxis, nasal discharge, oral lesions, sore throat, tinnitus or vertigo Allergy and Immunology ROS: negative for - hives or itchy/watery eyes Hematological and Lymphatic ROS: negative for - bleeding problems, bruising or swollen lymph nodes Endocrine ROS: negative for - galactorrhea, hair pattern changes, polydipsia/polyuria or  temperature intolerance Respiratory ROS: negative for - cough, hemoptysis, shortness of breath or wheezing Cardiovascular ROS: negative for - chest pain, dyspnea on exertion, edema or irregular heartbeat Gastrointestinal ROS: negative for - abdominal pain, diarrhea, hematemesis, nausea/vomiting or stool incontinence Genito-Urinary ROS: negative for - dysuria, hematuria, incontinence or urinary frequency/urgency Musculoskeletal ROS: negative for - joint swelling or muscular weakness Neurological ROS: as noted in HPI Dermatological ROS: negative for rash and skin lesion changes   Blood pressure 122/59, pulse 91, temperature 98.6 F (37 C), temperature source Oral, resp. rate 20, height 6\' 1"  (1.854 m), weight 122.471 kg (270 lb), SpO2 99 %.   Neurologic Examination:                                                                                                      HEENT-  Normocephalic, no lesions, without obvious abnormality.  Normal external eye and conjunctiva.  Normal TM's bilaterally.  Normal auditory canals and external ears. Normal external nose, mucus membranes and septum.  Normal pharynx. Cardiovascular- S1, S2 normal, pulses palpable throughout   Lungs- chest clear, no wheezing, rales, normal symmetric air entry Abdomen- normal findings: bowel sounds normal Extremities- no edema Lymph-no adenopathy palpable Musculoskeletal-no joint tenderness, deformity or swelling Skin-warm and dry, no hyperpigmentation, vitiligo, or suspicious lesions  Neurological Examination Mental Status: Patient does not respond to verbal stimuli.  Does not respond to deep sternal rub.  Does not follow commands.  No verbalizations  noted.  Cranial Nerves: II: patient does not respond confrontation bilaterally, pupils right 2 mm, left 2 mm,and reactive bilaterally III,IV,VI: doll's response absent bilaterally.  V,VII: corneal reflex present bilaterally  VIII: patient does not respond to verbal  stimuli IX,X: gag reflex present, XI: trapezius strength unable to test bilaterally XII: tongue strength unable to test Motor: Minimally withdraws from pain in all extremities Sensory: Does not respond to noxious stimuli in any extremity. Deep Tendon Reflexes:  2+ bilaterally Plantars: absent bilaterally Cerebellar: Unable to perform        Lab Results: Basic Metabolic Panel:  Recent Labs Lab 11/29/2015 1719 12/23/15 0020 12/23/15 0522 12/24/15 0226 12/25/15 0526 12/26/15 0448  NA 132*  --  134* 135 137 141  K 2.2*  --  2.3* 3.4* 3.7 3.7  CL 100*  --  104 108 115* 115*  CO2 19*  --  20* 17* 13* 12*  GLUCOSE 108*  --  110* 132* 133* 151*  BUN 37*  --  36* 33* 38* 48*  CREATININE 2.93*  --  2.84* 2.60* 2.64* 2.80*  CALCIUM 9.4  --  8.7* 8.8* 8.6* 9.3  MG  --  2.3  --   --   --   --     Liver Function Tests:  Recent Labs Lab 12/09/2015 1719 12/23/15 0522 12/24/15 0226 12/25/15 0526 12/26/15 0448  AST 164* 151* 171* 179* 181*  ALT 62 54 59 70* 84*  ALKPHOS 175* 162* 174* 191* 203*  BILITOT 46.9* 39.5* 42.6* 41.7* 45.0*  PROT 6.4* 5.3* 5.5* 5.5* 6.0*  ALBUMIN 2.3* 1.9* 1.9* 2.1* 2.2*    Recent Labs Lab 12/26/2015 1719  LIPASE 68*    Recent Labs Lab 12/23/15 0522 12/24/15 0226 12/26/15 0448  AMMONIA 60* 66* 112*    CBC:  Recent Labs Lab 12/20/2015 1719 12/23/15 0522 12/23/15 1004 12/24/15 0226 12/25/15 0526 12/26/15 0448  WBC 5.4 4.6  --  4.5 7.4 16.0*  HGB 10.1* 9.3*  --  9.2* 9.4* 10.1*  HCT 28.6* 26.7* 23.7* 26.9* 28.0* 30.1*  MCV 103.6* 104.3*  --  105.5* 108.9* 112.7*  PLT 77* 64*  --  66* 85* 138*    Cardiac Enzymes:  Recent Labs Lab 12/14/2015 1839 12/23/15 0020 12/23/15 0522 12/23/15 0948  TROPONINI <0.03 <0.03 <0.03 <0.03    Lipid Panel: No results for input(s): CHOL, TRIG, HDL, CHOLHDL, VLDL, LDLCALC in the last 168 hours.  CBG:  Recent Labs Lab 12/25/15 1701 12/25/15 2151 12/26/15 0014 12/26/15 0644  12/26/15 0819  GLUCAP 131* 124* 130* 177* 144*    Microbiology: Results for orders placed or performed during the hospital encounter of 12/20/2015  MRSA PCR Screening     Status: None   Collection Time: 12/14/2015 11:48 PM  Result Value Ref Range Status   MRSA by PCR NEGATIVE NEGATIVE Final    Comment:        The GeneXpert MRSA Assay (FDA approved for NASAL specimens only), is one component of a comprehensive MRSA colonization surveillance program. It is not intended to diagnose MRSA infection nor to guide or monitor treatment for MRSA infections.   Stool culture (children & immunocomp patients)     Status: None (Preliminary result)   Collection Time: 12/23/15  4:45 AM  Result Value Ref Range Status   Salmonella/Shigella Screen Final report  Final    Comment: (NOTE) Performed At: [facility] [address] [place], [place] [id number] Mila Homer MD [id number]    Campylobacter Culture PENDING  Incomplete   E coli, Shiga toxin Assay Negative Negative Final    Comment: (NOTE) Performed At: [facility] [address] [place], [place] [id number] Mila Homer MD [id number]   C difficile quick scan w PCR reflex     Status: Abnormal   Collection Time: 12/23/15  4:45 AM  Result Value Ref Range Status   C Diff antigen POSITIVE (A) NEGATIVE Final   C Diff toxin NEGATIVE NEGATIVE Final   C Diff interpretation   Final    C. difficile present, but toxin not detected. This indicates colonization. In most cases, this does not require treatment. If patient has signs and symptoms consistent with colitis, consider treatment. Requires ENTERIC precautions.  Clostridium Difficile by PCR     Status: Abnormal   Collection Time: 12/23/15  4:45 AM  Result Value Ref Range Status   Toxigenic C Difficile by pcr POSITIVE (A) NEGATIVE Final    Comment: CRITICAL RESULT CALLED TO, READ BACK BY AND VERIFIED WITH: Ivar Drape MD 15:45 12/24/15 ([email or web address])   STOOL CULTURE  REFLEX - RSASHR     Status: None   Collection Time: 12/23/15  4:45 AM  Result Value Ref Range Status   Stool Culture result 1 (RSASHR) Comment  Final    Comment: (NOTE) No Salmonella or Shigella recovered. Performed At: [facility] [address] [place], [place] [id number] Mila Homer MD [id number]     Coagulation Studies:  Recent Labs  12/24/15 0226 12/26/15 0448  LABPROT 24.2* 21.1*  INR 2.19* 1.83*    Imaging: Ct Head Wo Contrast  12/26/2015  CLINICAL DATA:  Seizures. EXAM: CT HEAD WITHOUT CONTRAST TECHNIQUE: Contiguous axial images were obtained from the base of the skull through the vertex without intravenous contrast. COMPARISON:  CT scan of December 22, 2015. FINDINGS: Bony calvarium appears intact. There is noted large subdural hematoma overlying the right cerebral cortex containing high density material consistent with acute hemorrhage. Also noted is subdural hematoma in the right tentorial and parafalcine regions. This results in 12 mm of right to left midline shift. The maximum thickness of this hematoma is 16 mm posteriorly. Ventricular size is within normal limits. No acute infarction is noted. IMPRESSION: Large right subdural hematoma is noted resulting in 12 mm of right to left midline shift. Critical Value/emergent results were called by telephone at the time of interpretation on 12/26/2015 at 10:05 am to [facility], the patient's nurse, who verbally acknowledged these results and will contact the referring physician. My attempts so far to contact this physician were unsuccessful. Electronically Signed   By: Lupita Raider, M.D.   On: 12/26/2015 10:08   Dg Chest Port 1 View  12/26/2015  CLINICAL DATA:  Known hepatic and renal failure with difficulty breathing EXAM: PORTABLE CHEST 1 VIEW COMPARISON:  02/27/2010 FINDINGS: Cardiac shadow is mildly enlarged but accentuated by the portable technique. The mediastinum is somewhat widened also likely related to the  portable technique and patient rotation to the right. Postsurgical changes in the cervical spine are seen. The lungs demonstrate no focal infiltrate or sizable effusion. No acute bony abnormality is noted. IMPRESSION: No definitive acute abnormality is seen. Electronically Signed   By: Alcide Clever M.D.   On: 12/26/2015 08:39       Assessment and plan per attending neurologist  Felicie Morn PA-C [facility] [phone]  12/26/2015, 10:15 AM   Assessment/Plan:  50 YO male with  acute hepatic encephalopathy, decompensated hepatic failure, and acute kidney disease. Calculated MELD  score today of 39 indicates very poor prognosis with 3 month mortality at approximately 52.6%. Palliative care had spoken with family and they were in agree ment with comfort care and hospice. patient has had multiple seizures and now found to have a right SDH. At this time he is full comfort care thus will treat seizures with medication but per family no further escalation of care and no intubation.   Recommend: 1) EEG--pending findings will address further adjustments with AED only for palliative measures

## 2015-12-26 NOTE — Progress Notes (Signed)
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 Patient was unable to provide any history secondary to his encephalopathy. Associated symptoms included nausea, poor by mouth intake, and loose stool for the past 2 months. In addition, his wife endorsed increasing lower extremity edema over this past month. The patient continued to drink " Socially"with his last alcohol drink 12/21/2015. He denied any over-the-counter supplements, herbals, or excessive acetaminophen use. In the ED, the patient was noted to have AST 164, ALT 62, phosphatase 175, total bilirubin 46.9. Ammonia was 64 with lipase 68. INR was 1.83. Patient is being managed for acute hepatic encephalopathy, decompensated hepatic cirrhosis/hepatic failure, acute on chronic kidney disease. GI and nephrology were consulted. Overall poor prognosis since admission to begin with. Overnight 6/28, faxing and waning mental status. On morning of 6/29, recurrent seizures and unresponsive between seizures. Neurology was consulted. AEDs were initiated. Stat CT head was obtained which showed large subdural hematoma with midline shift. Palliative care followed up with patient's spouse and appropriately transitioned to full comfort care and all aggressive measures were discontinued. Hospital death expected.  Assessment/Plan: Acute encephalopathy/hepatic encephalopathy -Ammonia 64 at the time of admission-->60-->66 -Continue lactulose: Due to profuse diarrhea, after discussing with Dr. Elnoria HowardHung, lactulose dose was decreased to 20 mg twice daily and added rifaximin on 6/28. -Acute on chronic renal failure also  contributing -Urinalysis is negative for pyuria -Check serum B12--1294 -RBC folate--905 -HIV--neg -Hepatitis B surface antigen--negative, Hepatitis C antibody--<0.1; hep A, IgM: Negative; hep B core AB, IgM: Negative - Continue thiamine supplementation. -CT brain negative for acute findings -RPR--neg - Rapid decline in mental status since 6/29 night. Please see discussion above.  Transaminasemia/Hyperbilirubinemia/Decompensated hepatic cirrhosis -Defer further workup of autoantibodies to GI if they feel prudent; unclear if this has been previously worked up by Dr. Elnoria HowardHung in outpt setting -AFP: 3.2  -6/25 Abd US--fatty liver with possible cirrhosis, no focal hepatic lesions, splenomegaly, mild ascites, normal gallbladder - Updated Dr. Elnoria HowardHung regarding patient's decline and comfort care.  Acute on chronic renal failure--CKD 3 - baseline creatinine 1.4-1.7  -Secondary to volume depletion from GI losses and third spacing fluid -continue IV fluids  -Discontinue lisinopril HCTZ  -serum creatinine 2.93-->2.60 -plateaued in this range on 6/28.  - Nephrology was consulted due to concern for hepatorenal syndrome and significant non-anion gap metabolic acidosis. As per nephrology input, he may have an element of prerenal as a tenia but cannot rule out hepatorenal syndrome. Obstruction not suspected. Worsening renal functions.  Thrombocytopenia/coagulopathy/ anemia -Likely due to chronic hepatic disease and splenomegaly/cirrhosis.  -Check fibrinogen--267 -Presenting INR 1.83-->2.19 -start po vitamin K if no DVT -Monitor for signs of bleeding-no overt bleeding reported. -Baseline hemoglobin 10-11 -FOBT-positive - Hemoglobin and platelet counts stable.   Diabetes mellitus type 2 -Hemoglobi<4.2.  -NovoLog sliding scale  -Discontinued metformin   Hyponatremia  -Secondary to hepatic cirrhosis and volume depletion -Continue to monitor  - Resolved for now.  Diarrhea -Follow up stool  culture: Negative for Escherichia coli, rest pending.  -C diff antigen positive, toxin negative--suspect colonization. As per Dr. Don Perkingat's discussion with ID, on oral Flagyl.  -now due to lactulose-reduced dose on 6/28  -12/23/15--flexi-seal inserted. Copious diarrhea.  Hypokalemia -secondary to GI loss -replete as needed and follow. -check Mag--2.3  Vomiting -continue PPI -mild lipase elevation nonspecific -advance to full liquids . Seems to have improved.   Lower extremity pain and edema -venous duplex on 6/27: Negative for DVT.   Alcohol abuse - No overt withdrawal. Apparently drank up to admission.   Non-anion gap metabolic acidosis  - Secondary to diarrhea. Management as indicated above. Bicarbonate drip started by nephrology. Follow BMP in a.m.     Disposition Plan: Expected Hospital death.  Family Communication:Wife updated at bedside 6/29-updated critical nature of patient's condition and expected Hospital demise. Comforted her. She verbalized understanding.  Total time 35 min; >50 spent counseling and coordinating care Code Status: DNR and full comfort care. DVT Prophylaxis: SCDs/Auto-anticoagulated    Consultants:  GI--Hung, nephrology and palliative care team Neurology CCM   Procedures: As Listed in Progress Note Above  Antibiotics: Oral Flagyl Rifaximin   Subjective: Seen early this morning along with nephrologist. Nurses called with report of 2 minutes generalized tonic-clonic seizure. As per spouse at bedside, waxing and waning mental status since last night. Unresponsive this morning.    Objective: Filed Vitals:   12/26/15 0830 12/26/15 0930 12/26/15 1000 12/26/15 1125  BP: 127/57 122/59 134/71 124/56  Pulse: 91 91 87 89  Temp:    98.5 F (36.9 C)  TempSrc:    Axillary  Resp: 21 20 21 21   Height:      Weight:      SpO2: 100% 99% 100% 98%    Intake/Output Summary (Last 24 hours) at 12/26/15 1420 Last data filed at 12/26/15 1400   Gross per 24 hour  Intake 1941.25 ml  Output   1400 ml  Net 541.25 ml   Weight change:  Exam:   General:  Very ill looking middle-aged male, Post ictal this morning after 2 minute GTC seizure-like activity. Scleral and skin icterus. Unresponsive and sonorous breathing.  HEENT: ++ icterus, No thrush, No neck mass, [place]/AT  Cardiovascular: RRR, S1/S2, no rubs, no gallops. Telemetry: Sinus rhythm.  Respiratory: reduced breath sounds in the bases but otherwise clear to auscultation. No increased work of breathing.   Abdomen: abdomen is mildly distended but soft and nontender. Ascites +. Normal bowel sounds heard. Rectal tube and Foley catheter +.  Extremities: 1 + LE edema, No lymphangitis, No petechiae, No rashes, no synovitis. Bilateral lower extremity SCDs.    CNS: Unresponsive to all stimuli.   Data Reviewed: I have personally reviewed following labs and imaging studies Basic Metabolic Panel:  Recent Labs Lab 12/16/2015 1719 12/23/15 0020 12/23/15 0522 12/24/15 0226 12/25/15 0526 12/26/15 0448  NA 132*  --  134* 135 137 141  K 2.2*  --  2.3* 3.4* 3.7 3.7  CL 100*  --  104 108 115* 115*  CO2 19*  --  20* 17* 13* 12*  GLUCOSE 108*  --  110* 132* 133* 151*  BUN 37*  --  36* 33* 38* 48*  CREATININE 2.93*  --  2.84* 2.60* 2.64* 2.80*  CALCIUM 9.4  --  8.7* 8.8* 8.6* 9.3  MG  --  2.3  --   --   --   --    Liver Function Tests:  Recent Labs Lab 12/06/2015 1719 12/23/15 0522 12/24/15 0226 12/25/15 0526 12/26/15 0448  AST 164* 151* 171* 179* 181*  ALT 62 54 59 70* 84*  ALKPHOS 175* 162* 174* 191* 203*  BILITOT 46.9* 39.5* 42.6* 41.7* 45.0*  PROT 6.4* 5.3*  5.5* 5.5* 6.0*  ALBUMIN 2.3* 1.9* 1.9* 2.1* 2.2*    Recent Labs Lab 12/21/2015 1719  LIPASE 68*    Recent Labs Lab 11/29/2015 1839 12/23/15 0522 12/24/15 0226 12/26/15 0448  AMMONIA 64* 60* 66* 112*   Coagulation Profile:  Recent Labs Lab 11/30/2015 1719 12/24/15 0226 12/26/15 0448  INR 1.83* 2.19*  1.83*   CBC:  Recent Labs Lab 12/04/2015 1719 12/23/15 0522 12/23/15 1004 12/24/15 0226 12/25/15 0526 12/26/15 0448  WBC 5.4 4.6  --  4.5 7.4 16.0*  HGB 10.1* 9.3*  --  9.2* 9.4* 10.1*  HCT 28.6* 26.7* 23.7* 26.9* 28.0* 30.1*  MCV 103.6* 104.3*  --  105.5* 108.9* 112.7*  PLT 77* 64*  --  66* 85* 138*   Cardiac Enzymes:  Recent Labs Lab 12/15/2015 1839 12/23/15 0020 12/23/15 0522 12/23/15 0948  TROPONINI <0.03 <0.03 <0.03 <0.03   BNP: Invalid input(s): POCBNP CBG:  Recent Labs Lab 12/25/15 2151 12/26/15 0014 12/26/15 0644 12/26/15 0819 12/26/15 1203  GLUCAP 124* 130* 177* 144* 121*   HbA1C: No results for input(s): HGBA1C in the last 72 hours. Urine analysis:    Component Value Date/Time   COLORURINE ORANGE* 12/02/2015 2208   APPEARANCEUR CLOUDY* 12/02/2015 2208   LABSPEC 1.016 12/01/2015 2208   PHURINE 6.0 12/04/2015 2208   GLUCOSEU NEGATIVE 12/04/2015 2208   HGBUR NEGATIVE 12/04/2015 2208   BILIRUBINUR LARGE* 12/13/2015 2208   KETONESUR 15* 12/08/2015 2208   PROTEINUR NEGATIVE 12/09/2015 2208   NITRITE NEGATIVE 12/07/2015 2208   LEUKOCYTESUR SMALL* 12/15/2015 2208   Sepsis Labs: @LABRCNTIP (procalcitonin:4,lacticidven:4) ) Recent Results (from the past 240 hour(s))  MRSA PCR Screening     Status: None   Collection Time: 12/11/2015 11:48 PM  Result Value Ref Range Status   MRSA by PCR NEGATIVE NEGATIVE Final    Comment:        The GeneXpert MRSA Assay (FDA approved for NASAL specimens only), is one component of a comprehensive MRSA colonization surveillance program. It is not intended to diagnose MRSA infection nor to guide or monitor treatment for MRSA infections.   Stool culture (children & immunocomp patients)     Status: None (Preliminary result)   Collection Time: 12/23/15  4:45 AM  Result Value Ref Range Status   Salmonella/Shigella Screen Final report  Final    Comment: (NOTE) Performed At: [facility] [address]  [place], [place] [id number] Mila Homer MD WU:[id number]    Campylobacter Culture PENDING  Incomplete   E coli, Shiga toxin Assay Negative Negative Final    Comment: (NOTE) Performed At: [facility] [address] [place], [place] [id number] Mila Homer MD VH:[id number]   C difficile quick scan w PCR reflex     Status: Abnormal   Collection Time: 12/23/15  4:45 AM  Result Value Ref Range Status   C Diff antigen POSITIVE (A) NEGATIVE Final   C Diff toxin NEGATIVE NEGATIVE Final   C Diff interpretation   Final    C. difficile present, but toxin not detected. This indicates colonization. In most cases, this does not require treatment. If patient has signs and symptoms consistent with colitis, consider treatment. Requires ENTERIC precautions.  Clostridium Difficile by PCR     Status: Abnormal   Collection Time: 12/23/15  4:45 AM  Result Value Ref Range Status   Toxigenic C Difficile by pcr POSITIVE (A) NEGATIVE Final    Comment: CRITICAL RESULT CALLED TO, READ BACK BY AND VERIFIED WITH: Ivar Drape MD 15:45 12/24/15 (  [email or web address])   STOOL CULTURE REFLEX - RSASHR     Status: None   Collection Time: 12/23/15  4:45 AM  Result Value Ref Range Status   Stool Culture result 1 (RSASHR) Comment  Final    Comment: (NOTE) No Salmonella or Shigella recovered. Performed At: [facility] [address] [place], [place] [id number] Mila HomerHancock William F MD WU:[phone]      Scheduled Meds: . FLUoxetine  20 mg Oral Daily  . insulin aspart  0-9 Units Subcutaneous TID WC  . lactulose  20 g Oral BID  . levETIRAcetam  500 mg Intravenous Q12H  . metroNIDAZOLE  500 mg Oral Q8H  . octreotide  100 mcg Subcutaneous Q12H  . pantoprazole  40 mg Oral BID  . prednisoLONE  40 mg Oral Daily  . rifaximin  550 mg Oral BID  . sodium chloride flush  3 mL Intravenous Q12H  . thiamine IV  500 mg Intravenous Daily   Continuous Infusions: . sodium chloride 10 mL/hr at 12/26/15 1000  . dextrose 5  % with KCl 20 mEq / L 20 mEq (12/26/15 0946)    Procedures/Studies: Ct Head Wo Contrast  12/26/2015  ADDENDUM REPORT: 12/26/2015 10:16 ADDENDUM: Critical Value/emergent results were called by telephone on 12/26/2015 at 10:16 am to Dr. Marcellus ScottANAND Binyamin Nelis , who verbally acknowledged these results. Electronically Signed   By: Lupita RaiderJames  Green Jr, M.D.   On: 12/26/2015 10:16  12/26/2015  CLINICAL DATA:  Seizures. EXAM: CT HEAD WITHOUT CONTRAST TECHNIQUE: Contiguous axial images were obtained from the base of the skull through the vertex without intravenous contrast. COMPARISON:  CT scan of December 22, 2015. FINDINGS: Bony calvarium appears intact. There is noted large subdural hematoma overlying the right cerebral cortex containing high density material consistent with acute hemorrhage. Also noted is subdural hematoma in the right tentorial and parafalcine regions. This results in 12 mm of right to left midline shift. The maximum thickness of this hematoma is 16 mm posteriorly. Ventricular size is within normal limits. No acute infarction is noted. IMPRESSION: Large right subdural hematoma is noted resulting in 12 mm of right to left midline shift. Critical Value/emergent results were called by telephone at the time of interpretation on 12/26/2015 at 10:05 am to [facility], the patient's nurse, who verbally acknowledged these results and will contact the referring physician. My attempts so far to contact this physician were unsuccessful. Electronically Signed: By: Lupita RaiderJames  Green Jr, M.D. On: 12/26/2015 10:08   Ct Head Wo Contrast  12/27/2015  CLINICAL DATA:  Worsening lethargy and ataxia over the past month. EXAM: CT HEAD WITHOUT CONTRAST TECHNIQUE: Contiguous axial images were obtained from the base of the skull through the vertex without intravenous contrast. COMPARISON:  05/21/2011 FINDINGS: There is no intracranial hemorrhage, mass or evidence of acute infarction. There is no extra-axial fluid collection. There is mild to  moderate generalized atrophy. Gray matter and white matter are unremarkable, with normal differentiation. Visible paranasal sinuses are clear except for mild retention cyst formation in the left maxillary sinus, unchanged. IMPRESSION: Mild generalized atrophy. No acute intracranial findings. The atrophy is worsened from 2012. Electronically Signed   By: Ellery Plunkaniel R Mitchell M.D.   On: 12/21/2015 23:18   [place] Abdomen Complete  12/25/2015  CLINICAL DATA:  Elevated liver function studies and jaundice. EXAM: ABDOMEN ULTRASOUND COMPLETE COMPARISON:  Abdominal CT scan 05/21/2011 FINDINGS: Gallbladder: Mild gallbladder distention. No gallstones, wall thickening or pericholecystic fluid. Common bile duct: Diameter: 3.0 mm Liver: The liver contour is slightly irregular.  Increased and coarse liver echogenicity with poor definition of the liver architecture and poor through transmission suggesting fatty infiltration. Cirrhosis is also possible. No focal hepatic lesions or intrahepatic biliary dilatation. IVC: Normal caliber Pancreas: Poorly visualized due to poor sonographic window. Spleen: Measures 20 x 17 x 10 cm.  Splenic volume is 1700 cubic cm. Right Kidney: Length: 15.0 cm. Normal renal cortical thickness and echogenicity without focal lesions or hydronephrosis. Left Kidney: Length: 12.8 cm. Normal renal cortical thickness and echogenicity without focal lesions or hydronephrosis. Abdominal aorta: Normal caliber Other findings: Mild ascites. IMPRESSION: 1. Suspect cirrhotic changes involving the liver and associated splenomegaly. No focal hepatic lesions or intrahepatic biliary dilatation. 2. Normal gallbladder and normal caliber common bile duct. 3. Poor visualization of the pancreas due to poor sonographic window. Electronically Signed   By: Rudie Meyer M.D.   On: 01-04-16 19:58   Dg Chest Port 1 View  12/26/2015  CLINICAL DATA:  Known hepatic and renal failure with difficulty breathing EXAM: PORTABLE CHEST 1 VIEW  COMPARISON:  02/27/2010 FINDINGS: Cardiac shadow is mildly enlarged but accentuated by the portable technique. The mediastinum is somewhat widened also likely related to the portable technique and patient rotation to the right. Postsurgical changes in the cervical spine are seen. The lungs demonstrate no focal infiltrate or sizable effusion. No acute bony abnormality is noted. IMPRESSION: No definitive acute abnormality is seen. Electronically Signed   By: Alcide Clever M.D.   On: 12/26/2015 08:39    Tiffiany Beadles, MD, FACP, FHM. [facility] Pager [phone]  If 7PM-7AM, please contact night-coverage [email or web address] Password [email or web address] 12/26/2015, 2:20 PM   LOS: 4 days

## 2015-12-26 NOTE — Progress Notes (Signed)
Subjective: More somnolent this AM.  Objective: Vital signs in last 24 hours: Temp:  [97.6 F (36.4 C)-99.1 F (37.3 C)] 98.6 F (37 C) (06/29 0600) Pulse Rate:  [67-96] 96 (06/29 0600) Resp:  [13-25] 25 (06/29 0600) BP: (102-157)/(59-75) 123/59 mmHg (06/29 0600) SpO2:  [98 %-100 %] 98 % (06/29 0600) Last BM Date: 12/25/15  Intake/Output from previous day: 06/28 0701 - 06/29 0700 In: 1926.8 [P.O.:720; I.V.:1206.8] Out: 1100 [Urine:800; Stool:300] Intake/Output this shift: Total I/O In: 675 [I.V.:675] Out: 350 [Urine:350]  General appearance: sleeping, not arousable GI: soft, non-tender; bowel sounds normal; no masses,  no organomegaly  Lab Results:  Recent Labs  12/24/15 0226 12/25/15 0526 12/26/15 0448  WBC 4.5 7.4 16.0*  HGB 9.2* 9.4* 10.1*  HCT 26.9* 28.0* 30.1*  PLT 66* 85* 138*   BMET  Recent Labs  12/24/15 0226 12/25/15 0526 12/26/15 0448  NA 135 137 141  K 3.4* 3.7 3.7  CL 108 115* 115*  CO2 17* 13* 12*  GLUCOSE 132* 133* 151*  BUN 33* 38* 48*  CREATININE 2.60* 2.64* 2.80*  CALCIUM 8.8* 8.6* 9.3   LFT  Recent Labs  12/26/15 0448  PROT 6.0*  ALBUMIN 2.2*  AST 181*  ALT 84*  ALKPHOS 203*  BILITOT 45.0*   PT/INR  Recent Labs  12/24/15 0226 12/26/15 0448  LABPROT 24.2* 21.1*  INR 2.19* 1.83*   Hepatitis Panel  Recent Labs  12/23/15 1004 12/25/15 0526  HEPBSAG Negative  --   HCVAB  --  <0.1   C-Diff No results for input(s): CDIFFTOX in the last 72 hours. Fecal Lactopherrin No results for input(s): FECLLACTOFRN in the last 72 hours.  Studies/Results: No results found.  Medications:  Scheduled: . FLUoxetine  20 mg Oral Daily  . insulin aspart  0-9 Units Subcutaneous TID WC  . lactulose  20 g Oral BID  . metroNIDAZOLE  500 mg Oral Q8H  . pantoprazole  40 mg Oral BID  . prednisoLONE  40 mg Oral Daily  . rifaximin  550 mg Oral BID  . sodium chloride flush  3 mL Intravenous Q12H  . thiamine IV  500 mg Intravenous Daily    Continuous: . sodium chloride 0.45 % 925 mL with sodium bicarbonate 75 mEq infusion 75 mL/hr at 12/26/15 0006    Assessment/Plan: 1) Hepatic failure. 2) Hepatic encephalopathy. 3) Renal failure.  4) Leukocytosis.   His bilirubin has increased, but his INR has improved.  Mild worsening of his renal function, but prerenal azotemia is still a possibility.  He still has good urine output.  Xifaxan was started, but he has not been able to tolerate oral medications secondary to his encephalopathy.  There is a new leukocytosis and the source is not clear.  No reports of any fever.  Appreciate Renal and Palliative Care input.  Prognosis is extremely poor.  Plan: 1) Pending his mental status, lactulose enemas may be required. 2) Continue with prednisolone.   LOS: 4 days   [name] D 12/26/2015, 6:56 AM

## 2015-12-26 NOTE — Progress Notes (Signed)
   12/26/15 1400  Clinical Encounter Type  Visited With Patient and family together;Health care provider  Visit Type Initial;Psychological support;Spiritual support;Social support  Referral From Nurse  Consult/Referral To Chaplain;Palliative care;Faith community  Spiritual Encounters  Spiritual Needs Literature;Prayer;Emotional;Grief support  Stress Factors  Patient Stress Factors None identified  Family Stress Factors Financial concerns;Health changes   Chaplain visited with family ans Pt. Chaplain introduced herself and provided care via prayer.   Thanks,  Tanja PortBeatrice M Lolly Glaus, Chaplain

## 2015-12-26 NOTE — Progress Notes (Signed)
Dear Doctor: [name] This patient has been identified as a candidate for PICC for the following reason (s): IV therapy over 48 hours, poor veins/poor circulatory system (CHF, COPD, emphysema, diabetes, steroid use, IV drug abuse, etc.) and restarts due to phlebitis and infiltration in 24 hours If you agree, please write an order for the indicated device. For any questions contact the Vascular Access Team at [phone] if no answer, please leave a message.  Thank you for supporting the early vascular access assessment program.

## 2015-12-26 NOTE — Care Management Note (Signed)
Case Management Note  Patient Details  Name: [name] MRN: [id number] Date of Birth: July 21, 1965  Subjective/Objective:     Pt with new dx of SDH, spouse and dtr met with PC team yesterday and again this a.m. - stated they do not want escalation of care.  Will alert CSW that pt may be a candidate for residential hospice facility.             Expected Discharge Plan:  [place]  In-House Referral:  Clinical Social Work  Discharge planning Services  CM Consult  Status of Service:  In process, will continue to follow  Girard Cooter, RN 12/26/2015, 10:28 AM

## 2015-12-26 NOTE — Progress Notes (Signed)
0750-Pt's wife ran out of room yelling 'help, he's having a seizure.' Upon arrival to room, patient was twitching his eyes and jerking his arms. Seizure precautions initiated. Lasted approximately two minutes. Post-ictal, patient de-sated to 50% but quickly went up to mid 90's on 5L [place]. Pt unresponsive to all stimuli. MD came to bedside and Ativan 1mg  given. Stat orders received.   0820-Pt witnessed seizing again by radiology tech at bedside. Lasted approximately two minutes. Stat Ativan 1mg  order received.   M.Sabena Winner, RN

## 2015-12-27 DIAGNOSIS — K7290 Hepatic failure, unspecified without coma: Secondary | ICD-10-CM

## 2015-12-27 LAB — GLUCOSE, CAPILLARY: Glucose-Capillary: 152 mg/dL — ABNORMAL HIGH (ref 65–99)

## 2015-12-27 MED ORDER — SCOPOLAMINE 1 MG/3DAYS TD PT72
1.0000 | MEDICATED_PATCH | TRANSDERMAL | Status: DC
  Administered 2015-12-27: 1.5 mg via TRANSDERMAL
  Filled 2015-12-27: qty 1

## 2015-12-27 MED ORDER — SODIUM CHLORIDE 0.9 % IV SOLN
25.0000 ug/h | INTRAVENOUS | Status: DC
  Administered 2015-12-27 – 2015-12-28 (×2): 25 ug/h via INTRAVENOUS
  Filled 2015-12-27 (×2): qty 50

## 2015-12-27 MED ORDER — SODIUM CHLORIDE 0.9 % IV SOLN
750.0000 mg | Freq: Two times a day (BID) | INTRAVENOUS | Status: DC
  Administered 2015-12-27 – 2015-12-29 (×6): 750 mg via INTRAVENOUS
  Filled 2015-12-27 (×7): qty 7.5

## 2015-12-27 NOTE — Progress Notes (Signed)
Nutrition Brief Note  Chart reviewed. Pt now transitioning to comfort care.  No further nutrition interventions warranted at this time.  Please re-consult as needed.   Tyson Masin A. Javiana Anwar, RD, LDN, CDE Pager: [phone] After hours Pager: [phone]  

## 2015-12-27 NOTE — Progress Notes (Signed)
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 Patient was unable to provide any history secondary to his encephalopathy. Associated symptoms included nausea, poor by mouth intake, and loose stool for the past 2 months. In addition, his wife endorsed increasing lower extremity edema over this past month. The patient continued to drink " Socially"with his last alcohol drink 12/21/2015. He denied any over-the-counter supplements, herbals, or excessive acetaminophen use. In the ED, the patient was noted to have AST 164, ALT 62, phosphatase 175, total bilirubin 46.9. Ammonia was 64 with lipase 68. INR was 1.83. Patient is being managed for acute hepatic encephalopathy, decompensated hepatic cirrhosis/hepatic failure, acute on chronic kidney disease. GI and nephrology were consulted. Overall poor prognosis since admission to begin with. Overnight 6/28, waxing and waning mental status. On morning of 6/29, recurrent seizures and unresponsive between seizures. Neurology was consulted. AEDs were initiated. Stat CT head was obtained which showed large subdural hematoma with midline shift. Palliative care followed up with patient's spouse and appropriately transitioned to full comfort care and all aggressive measures were discontinued. Hospital death expected. Palliative care following and adjusting medications for full comfort/terminal care.  Assessment/Plan: Acute encephalopathy/hepatic encephalopathy -Ammonia 64 at the time of admission-->60-->66 -Continue lactulose: Due to profuse diarrhea, after discussing with Dr. Elnoria Howard, lactulose dose was decreased to  20 mg twice daily and added rifaximin on 6/28. -Acute on chronic renal failure also contributing -Urinalysis is negative for pyuria -Check serum B12--1294 -RBC folate--905 -HIV--neg -Hepatitis B surface antigen--negative, Hepatitis C antibody--<0.1; hep A, IgM: Negative; hep B core AB, IgM: Negative - Continue thiamine supplementation. -CT brain negative for acute findings -RPR--neg - Rapid decline in mental status since 6/29 night. Please see discussion above.  Transaminasemia/Hyperbilirubinemia/Decompensated hepatic cirrhosis -Defer further workup of autoantibodies to GI if they feel prudent; unclear if this has been previously worked up by Dr. Elnoria Howard in outpt setting -AFP: 3.2  -6/25 Abd US--fatty liver with possible cirrhosis, no focal hepatic lesions, splenomegaly, mild ascites, normal gallbladder - Updated Dr. Elnoria Howard regarding patient's decline and comfort care.  Acute on chronic renal failure--CKD 3 - baseline creatinine 1.4-1.7  -Secondary to volume depletion from GI losses and third spacing fluid -continue IV fluids  -Discontinue lisinopril HCTZ  -serum creatinine 2.93-->2.60 -plateaued in this range on 6/28.  - Nephrology was consulted due to concern for hepatorenal syndrome and significant non-anion gap metabolic acidosis. As per nephrology input, he may have an element of prerenal as a tenia but cannot rule out hepatorenal syndrome. Obstruction not suspected. Worsening renal functions.  Thrombocytopenia/coagulopathy/ anemia -Likely due to chronic hepatic disease and splenomegaly/cirrhosis.  -Check fibrinogen--267 -Presenting INR 1.83-->2.19 -start po vitamin K if no DVT -Monitor for signs of bleeding-no overt bleeding reported. -Baseline hemoglobin 10-11 -FOBT-positive - Hemoglobin and platelet counts stable.   Diabetes mellitus type 2 -Hemoglobi<4.2.  -NovoLog sliding scale  -Discontinued metformin   Hyponatremia  -Secondary to hepatic cirrhosis and volume  depletion -Continue to monitor  - Resolved for now.  Diarrhea -Follow up stool culture: Negative for Escherichia coli, rest pending.  -C diff antigen positive, toxin negative--suspect colonization. As per Dr. Don Perking discussion with ID, on oral Flagyl.  -now due  to lactulose-reduced dose on 6/28  -12/23/15--flexi-seal inserted. Copious diarrhea.   Hypokalemia -secondary to GI loss -replete as needed and follow. -check Mag--2.3  Vomiting -continue PPI -mild lipase elevation nonspecific -advance to full liquids . Seems to have improved.   Lower extremity pain and edema -venous duplex on 6/27: Negative for DVT.   Alcohol abuse - No overt withdrawal. Apparently drank up to admission.   Non-anion gap metabolic acidosis  - Secondary to diarrhea. Management as indicated above. Bicarbonate drip started by nephrology. Follow BMP in a.m.     Disposition Plan: Expected Hospital death.  Family Communication:Discussed at length and comforted patient's family at bedside including his spouse, daughter, mother-in-law and sister-in-law. Code Status: DNR and full comfort care. DVT Prophylaxis: SCDs/Auto-anticoagulated    Consultants:  GI--[place], nephrology and palliative care team Neurology CCM   Procedures: As Listed in Progress Note Above  Antibiotics: Oral Flagyl Rifaximin   Subjective: Seen early this morning along with nephrologist. Nurses called with report of 2 minutes generalized tonic-clonic seizure. As per spouse at bedside, waxing and waning mental status since last night. Unresponsive this morning.    Objective: Filed Vitals:   12/26/15 1947 12/27/15 0508 12/27/15 0738 12/27/15 0739  BP: 96/57 107/59    Pulse: 80 79    Temp: 97.5 F (36.4 C) 98.3 F (36.8 C)    TempSrc: Oral Axillary    Resp: 20     Height:  (1.854 m)     Weight: 75.9 kg (167 lb 5.3 oz)     SpO2: 100% 100% 100% 100%    Intake/Output Summary (Last 24 hours) at 12/27/15  1459 Last data filed at 12/27/15 1107  Gross per 24 hour  Intake 758.67 ml  Output   1125 ml  Net -366.33 ml   Weight change:  Exam:   General:  Very ill looking middle-aged male,Occasionally opened eyes briefly to call this morning.  HEENT: ++ icterus, No thrush, No neck mass, [place]/AT  Cardiovascular: RRR, S1/S2, no rubs, no gallops. Telemetry: Sinus rhythm.  Respiratory: reduced breath sounds in the bases but otherwise clear to auscultation. No increased work of breathing.   Abdomen: abdomen is mildly distended but soft and nontender. Ascites +. Normal bowel sounds heard. Rectal tube and Foley catheter +.  Extremities: 1 + LE edema, No lymphangitis, No petechiae, No rashes, no synovitis. Bilateral lower extremity SCDs.    CNS: Mental status as above.   Data Reviewed: I have personally reviewed following labs and imaging studies Basic Metabolic Panel:  Recent Labs Lab 26-Dec-2015 1719 12/23/15 0020 12/23/15 0522 12/24/15 0226 12/25/15 0526 12/26/15 0448  NA 132*  --  134* 135 137 141  K 2.2*  --  2.3* 3.4* 3.7 3.7  CL 100*  --  104 108 115* 115*  CO2 19*  --  20* 17* 13* 12*  GLUCOSE 108*  --  110* 132* 133* 151*  BUN 37*  --  36* 33* 38* 48*  CREATININE 2.93*  --  2.84* 2.60* 2.64* 2.80*  CALCIUM 9.4  --  8.7* 8.8* 8.6* 9.3  MG  --  2.3  --   --   --   --    Liver Function Tests:  Recent Labs Lab 2015-12-26 1719 12/23/15 0522 12/24/15 0226 12/25/15 0526 12/26/15 0448  AST 164* 151* 171* 179* 181*  ALT 62 54 59 70* 84*  ALKPHOS 175* 162* 174* 191* 203*  BILITOT 46.9* 39.5* 42.6* 41.7* 45.0*  PROT 6.4* 5.3* 5.5* 5.5*  6.0*  ALBUMIN 2.3* 1.9* 1.9* 2.1* 2.2*    Recent Labs Lab 01/19/16 1719  LIPASE 68*    Recent Labs Lab January 19, 2016 1839 12/23/15 0522 12/24/15 0226 12/26/15 0448  AMMONIA 64* 60* 66* 112*   Coagulation Profile:  Recent Labs Lab 2016/01/19 1719 12/24/15 0226 12/26/15 0448  INR 1.83* 2.19* 1.83*   CBC:  Recent Labs Lab  01-19-16 1719 12/23/15 0522 12/23/15 1004 12/24/15 0226 12/25/15 0526 12/26/15 0448  WBC 5.4 4.6  --  4.5 7.4 16.0*  HGB 10.1* 9.3*  --  9.2* 9.4* 10.1*  HCT 28.6* 26.7* 23.7* 26.9* 28.0* 30.1*  MCV 103.6* 104.3*  --  105.5* 108.9* 112.7*  PLT 77* 64*  --  66* 85* 138*   Cardiac Enzymes:  Recent Labs Lab 01/19/2016 1839 12/23/15 0020 12/23/15 0522 12/23/15 0948  TROPONINI <0.03 <0.03 <0.03 <0.03   BNP: Invalid input(s): POCBNP CBG:  Recent Labs Lab 12/26/15 0014 12/26/15 0644 12/26/15 0819 12/26/15 1203 12/26/15 1601  GLUCAP 130* 177* 144* 121* 152*   HbA1C: No results for input(s): HGBA1C in the last 72 hours. Urine analysis:    Component Value Date/Time   COLORURINE ORANGE* January 19, 2016 2208   APPEARANCEUR CLOUDY* 01/19/2016 2208   LABSPEC 1.016 01/19/16 2208   PHURINE 6.0 01/19/16 2208   GLUCOSEU NEGATIVE 01-19-2016 2208   HGBUR NEGATIVE 01/19/2016 2208   BILIRUBINUR LARGE* 01/19/2016 2208   KETONESUR 15* 2016-01-19 2208   PROTEINUR NEGATIVE 2016-01-19 2208   NITRITE NEGATIVE 01-19-2016 2208   LEUKOCYTESUR SMALL* 01-19-2016 2208   Sepsis Labs: @LABRCNTIP (procalcitonin:4,lacticidven:4) ) Recent Results (from the past 240 hour(s))  MRSA PCR Screening     Status: None   Collection Time: Jan 19, 2016 11:48 PM  Result Value Ref Range Status   MRSA by PCR NEGATIVE NEGATIVE Final    Comment:        The GeneXpert MRSA Assay (FDA approved for NASAL specimens only), is one component of a comprehensive MRSA colonization surveillance program. It is not intended to diagnose MRSA infection nor to guide or monitor treatment for MRSA infections.   Stool culture (children & immunocomp patients)     Status: None (Preliminary result)   Collection Time: 12/23/15  4:45 AM  Result Value Ref Range Status   Salmonella/Shigella Screen Final report  Final    Comment: (NOTE) Performed At: [facility] [address] [place], [place] [id number] Mila Homer MD WU:[id number]    Campylobacter Culture PENDING  Incomplete   E coli, Shiga toxin Assay Negative Negative Final    Comment: (NOTE) Performed At: [facility] [address] [place], [place] [id number] Mila Homer MD VH:[id number]   C difficile quick scan w PCR reflex     Status: Abnormal   Collection Time: 12/23/15  4:45 AM  Result Value Ref Range Status   C Diff antigen POSITIVE (A) NEGATIVE Final   C Diff toxin NEGATIVE NEGATIVE Final   C Diff interpretation   Final    C. difficile present, but toxin not detected. This indicates colonization. In most cases, this does not require treatment. If patient has signs and symptoms consistent with colitis, consider treatment. Requires ENTERIC precautions.  Clostridium Difficile by PCR     Status: Abnormal   Collection Time: 12/23/15  4:45 AM  Result Value Ref Range Status   Toxigenic C Difficile by pcr POSITIVE (A) NEGATIVE Final    Comment: CRITICAL RESULT CALLED TO, READ BACK BY AND VERIFIED WITH: Ivar Drape MD 15:45 12/24/15 ([email or web address])  STOOL CULTURE REFLEX - RSASHR     Status: None   Collection Time: 12/23/15  4:45 AM  Result Value Ref Range Status   Stool Culture result 1 (RSASHR) Comment  Final    Comment: (NOTE) No Salmonella or Shigella recovered. Performed At: [facility] [address] [place], [place] [id number] Mila HomerHancock William F MD WU:[phone]      Scheduled Meds: . levETIRAcetam  750 mg Intravenous Q12H  . scopolamine  1 patch Transdermal Q72H  . sodium chloride flush  3 mL Intravenous Q12H   Continuous Infusions: . sodium chloride 10 mL/hr at 12/26/15 1000  . fentaNYL infusion INTRAVENOUS 75 mcg/hr (12/27/15 1407)    Procedures/Studies: Ct Head Wo Contrast  12/26/2015  ADDENDUM REPORT: 12/26/2015 10:16 ADDENDUM: Critical Value/emergent results were called by telephone on 12/26/2015 at 10:16 am to Dr. Marcellus ScottANAND Zamiya Dillard , who verbally acknowledged these results. Electronically Signed   By:  Lupita RaiderJames  Green Jr, M.D.   On: 12/26/2015 10:16  12/26/2015  CLINICAL DATA:  Seizures. EXAM: CT HEAD WITHOUT CONTRAST TECHNIQUE: Contiguous axial images were obtained from the base of the skull through the vertex without intravenous contrast. COMPARISON:  CT scan of December 22, 2015. FINDINGS: Bony calvarium appears intact. There is noted large subdural hematoma overlying the right cerebral cortex containing high density material consistent with acute hemorrhage. Also noted is subdural hematoma in the right tentorial and parafalcine regions. This results in 12 mm of right to left midline shift. The maximum thickness of this hematoma is 16 mm posteriorly. Ventricular size is within normal limits. No acute infarction is noted. IMPRESSION: Large right subdural hematoma is noted resulting in 12 mm of right to left midline shift. Critical Value/emergent results were called by telephone at the time of interpretation on 12/26/2015 at 10:05 am to [facility], the patient's nurse, who verbally acknowledged these results and will contact the referring physician. My attempts so far to contact this physician were unsuccessful. Electronically Signed: By: Lupita RaiderJames  Green Jr, M.D. On: 12/26/2015 10:08   Ct Head Wo Contrast  11/30/2015  CLINICAL DATA:  Worsening lethargy and ataxia over the past month. EXAM: CT HEAD WITHOUT CONTRAST TECHNIQUE: Contiguous axial images were obtained from the base of the skull through the vertex without intravenous contrast. COMPARISON:  05/21/2011 FINDINGS: There is no intracranial hemorrhage, mass or evidence of acute infarction. There is no extra-axial fluid collection. There is mild to moderate generalized atrophy. Gray matter and white matter are unremarkable, with normal differentiation. Visible paranasal sinuses are clear except for mild retention cyst formation in the left maxillary sinus, unchanged. IMPRESSION: Mild generalized atrophy. No acute intracranial findings. The atrophy is worsened from 2012.  Electronically Signed   By: Ellery Plunkaniel R Mitchell M.D.   On: 11/29/2015 23:18   [place] Abdomen Complete  12/07/2015  CLINICAL DATA:  Elevated liver function studies and jaundice. EXAM: ABDOMEN ULTRASOUND COMPLETE COMPARISON:  Abdominal CT scan 05/21/2011 FINDINGS: Gallbladder: Mild gallbladder distention. No gallstones, wall thickening or pericholecystic fluid. Common bile duct: Diameter: 3.0 mm Liver: The liver contour is slightly irregular. Increased and coarse liver echogenicity with poor definition of the liver architecture and poor through transmission suggesting fatty infiltration. Cirrhosis is also possible. No focal hepatic lesions or intrahepatic biliary dilatation. IVC: Normal caliber Pancreas: Poorly visualized due to poor sonographic window. Spleen: Measures 20 x 17 x 10 cm.  Splenic volume is 1700 cubic cm. Right Kidney: Length: 15.0 cm. Normal renal cortical thickness and echogenicity without focal lesions or hydronephrosis. Left Kidney: Length:  12.8 cm. Normal renal cortical thickness and echogenicity without focal lesions or hydronephrosis. Abdominal aorta: Normal caliber Other findings: Mild ascites. IMPRESSION: 1. Suspect cirrhotic changes involving the liver and associated splenomegaly. No focal hepatic lesions or intrahepatic biliary dilatation. 2. Normal gallbladder and normal caliber common bile duct. 3. Poor visualization of the pancreas due to poor sonographic window. Electronically Signed   By: Rudie MeyerP.  Gallerani M.D.   On: 03/19/2016 19:58   Dg Chest Port 1 View  12/26/2015  CLINICAL DATA:  Known hepatic and renal failure with difficulty breathing EXAM: PORTABLE CHEST 1 VIEW COMPARISON:  02/27/2010 FINDINGS: Cardiac shadow is mildly enlarged but accentuated by the portable technique. The mediastinum is somewhat widened also likely related to the portable technique and patient rotation to the right. Postsurgical changes in the cervical spine are seen. The lungs demonstrate no focal infiltrate or  sizable effusion. No acute bony abnormality is noted. IMPRESSION: No definitive acute abnormality is seen. Electronically Signed   By: Alcide CleverMark  Lukens M.D.   On: 12/26/2015 08:39    Toluwanimi Radebaugh, MD, FACP, FHM. [facility] Pager [phone]  If 7PM-7AM, please contact night-coverage [email or web address] Password [email or web address] 12/27/2015, 2:59 PM   LOS: 5 days

## 2015-12-27 NOTE — Progress Notes (Signed)
Daily Progress Note   Patient Name: [name]       Date: 12/27/2015 DOB: December 22, 1965  Age: 50 y.o. MRN#: [id number] Attending Physician: Elease EtienneAnand D Hongalgi, MD Primary Care Physician: [name],[name], MD Admit Date: 12/26/2015  Reason for Consultation/Follow-up: Terminal Care  Subjective:  actively dying Not responsive See below   Length of Stay: 5  Current Medications: Scheduled Meds:  . levETIRAcetam  750 mg Intravenous Q12H  . sodium chloride flush  3 mL Intravenous Q12H    Continuous Infusions: . sodium chloride 10 mL/hr at 12/26/15 1000  . fentaNYL infusion INTRAVENOUS      PRN Meds: [DISCONTINUED] acetaminophen **OR** acetaminophen, fentaNYL (SUBLIMAZE) injection, glycopyrrolate **OR** glycopyrrolate **OR** glycopyrrolate, LORazepam, [DISCONTINUED] ondansetron **OR** ondansetron (ZOFRAN) IV, polyvinyl alcohol  Physical Exam         Coarse breath sounds gurgling  S1 S2 Abdomen distended Edema Non responsive Icteric  Vital Signs: BP 107/59 mmHg  Pulse 79  Temp(Src) 98.3 F (36.8 C) (Axillary)  Resp 20  Ht 6\' 1"  (1.854 m)  Wt 75.9 kg (167 lb 5.3 oz)  BMI 22.08 kg/m2  SpO2 100% SpO2: SpO2: 100 % O2 Device: O2 Device: Nasal Cannula O2 Flow Rate: O2 Flow Rate (L/min): 2 L/min  Intake/output summary:   Intake/Output Summary (Last 24 hours) at 12/27/15 1111 Last data filed at 12/27/15 1107  Gross per 24 hour  Intake 983.67 ml  Output   1125 ml  Net -141.33 ml   LBM: Last BM Date: 12/26/15 Baseline Weight: Weight: 122.471 kg (270 lb) Most recent weight: Weight: 75.9 kg (167 lb 5.3 oz)       Palliative Assessment/Data:    Flowsheet Rows        Most Recent Value   Intake Tab    Referral Department  Hospitalist   Unit at Time of Referral   Intermediate Care Unit   Palliative Care Primary Diagnosis  Neurology   Date Notified  12/25/15   Palliative Care Type  Return patient Palliative Care   Reason for referral  End of Life Care Assistance   Date of Admission  12/13/2015   Date first seen by Palliative Care  12/25/15   # of days Palliative referral response time  0 Day(s)   # of days IP prior to Palliative referral  3  Clinical Assessment    Palliative Performance Scale Score  20%   Psychosocial & Spiritual Assessment    Palliative Care Outcomes    Patient/Family meeting held?  Yes   Who was at the meeting?  wife daughter    Palliative Care Outcomes  Clarified goals of care, Counseled regarding hospice, Provided end of life care assistance   Patient/Family wishes: Interventions discontinued/not started   Mechanical Ventilation, BiPAP, Tube feedings/TPN, Trach, PEG      Patient Active Problem List   Diagnosis Date Noted  . Acute respiratory failure with hypoxia (HCC) 12/26/2015  . Seizure (HCC)   . Dying care   . Subdural hematoma (HCC)   . Advance care planning   . Goals of care, counseling/discussion   . Palliative care encounter   . Acute renal failure superimposed on stage 3 chronic kidney disease (HCC) 12/23/2015  . Encephalopathy, hepatic (HCC) 12/23/2015  . Decompensated hepatic cirrhosis (HCC) 12/23/2015  . Abnormal liver function December 03, 2015  . Acute renal failure (ARF) (HCC) December 03, 2015  . Hypokalemia December 03, 2015  . Anemia December 03, 2015  . Hyponatremia December 03, 2015  . Thrombocytopenia (HCC) December 03, 2015  . Acute liver failure December 03, 2015  . Altered mental status December 03, 2015  . Acute encephalopathy   . Severe obesity (BMI >= 40) (HCC) 02/28/2015  . Narcotic dependence (HCC) 02/28/2015  . Chronic pain syndrome 02/28/2015  . Failed back syndrome, lumbar 02/28/2015  . Sleep apnea 02/28/2015    Palliative Care Assessment & Plan   Patient Profile:    Assessment:  seizures Subdural brain bleed with midline  shift Hepatic encephalopathy Liver failure Kidney failure Cirrhosis.   Recommendations/Plan: Comfort measures only, will start fentanyl infusion. Prn needs noted. Discussed with wife and RN  Goals of Care and Additional Recommendations:  Limitations on Scope of Treatment: Full Comfort Care  Code Status:    Code Status Orders        Start     Ordered   12/26/15 1031  Do not attempt resuscitation (DNR)   Continuous    Question Answer Comment  In the event of cardiac or respiratory ARREST Do not call a "code blue"   In the event of cardiac or respiratory ARREST Do not perform Intubation, CPR, defibrillation or ACLS   In the event of cardiac or respiratory ARREST Use medication by any route, position, wound care, and other measures to relive pain and suffering. May use oxygen, suction and manual treatment of airway obstruction as needed for comfort.      12/26/15 1031    Code Status History    Date Active Date Inactive Code Status Order ID Comments User Context   2015/10/01 10:40 PM 12/26/2015 10:31 AM DNR [id number]  Pearson GrippeJames Kim, MD ED       Prognosis:   Hours - Days  Discharge Planning:  Anticipated Hospital Death  Care plan was discussed with  Wife daughter, RN Thank you for allowing the Palliative Medicine Team to assist in the care of this patient.   Time In: 9:55 Time Out: 1030 Total Time 35 Prolonged Time Billed  no       Greater than 50%  of this time was spent counseling and coordinating care related to the above assessment and plan.  Rosalin HawkingZeba Leeza Heiner, MD [phone]  [phone]  Please contact Palliative Medicine Team phone at [phone] for questions and concerns.

## 2015-12-27 NOTE — Progress Notes (Signed)
Repositioned refused by pts. Family member

## 2015-12-27 NOTE — Progress Notes (Addendum)
[place], [place] paged on palliative team to make aware patient has been receiving PRN fentanyl frequently and still remains with labored breathing with accessory muscle use. Patient unable to tell me if he is in pain at this time. Family at bedside. Awaiting orders clarifying if patient can receive something else to help manage pain.   10:21 AM Voicemail left with palliative medicine team. Awaiting orders  11:08 AM Dr. Linna DarnerAnwar paged and stated she will placed orders

## 2015-12-28 LAB — STOOL CULTURE: E COLI SHIGA TOXIN ASSAY: NEGATIVE

## 2015-12-28 LAB — STOOL CULTURE REFLEX - RSASHR

## 2015-12-28 LAB — STOOL CULTURE REFLEX - CMPCXR

## 2015-12-28 MED ORDER — ATROPINE SULFATE 1 % OP SOLN
4.0000 [drp] | Freq: Four times a day (QID) | OPHTHALMIC | Status: DC
  Administered 2015-12-28 – 2015-12-29 (×8): 4 [drp] via SUBLINGUAL
  Filled 2015-12-28: qty 5

## 2015-12-28 NOTE — Progress Notes (Signed)
Prn iv robinul and ativan administered for comfort

## 2015-12-28 NOTE — Progress Notes (Signed)
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 Patient was unable to provide any history secondary to his encephalopathy. Associated symptoms included nausea, poor by mouth intake, and loose stool for the past 2 months. In addition, his wife endorsed increasing lower extremity edema over this past month. The patient continued to drink " Socially"with his last alcohol drink 12/21/2015. He denied any over-the-counter supplements, herbals, or excessive acetaminophen use. In the ED, the patient was noted to have AST 164, ALT 62, phosphatase 175, total bilirubin 46.9. Ammonia was 64 with lipase 68. INR was 1.83. Patient is being managed for acute hepatic encephalopathy, decompensated hepatic cirrhosis/hepatic failure, acute on chronic kidney disease. GI and nephrology were consulted. Overall poor prognosis since admission to begin with. Overnight 6/28, waxing and waning mental status. On morning of 6/29, recurrent seizures and unresponsive between seizures. Neurology was consulted. AEDs were initiated. Stat CT head was obtained which showed large subdural hematoma with midline shift. Palliative care followed up with patient's spouse and appropriately transitioned to full comfort care and all aggressive measures were discontinued. Hospital death expected. Palliative care following and adjusting medications for full comfort/terminal care. Actively dying. Adjusted medications for terminal secretions.    Assessment/Plan: Acute encephalopathy/hepatic encephalopathy -Ammonia 64 at the time of admission-->60-->66 -Continue lactulose: Due to profuse diarrhea,  after discussing with Dr. Elnoria Howard, lactulose dose was decreased to 20 mg twice daily and added rifaximin on 6/28. -Acute on chronic renal failure also contributing -Urinalysis is negative for pyuria -Check serum B12--1294 -RBC folate--905 -HIV--neg -Hepatitis B surface antigen--negative, Hepatitis C antibody--<0.1; hep A, IgM: Negative; hep B core AB, IgM: Negative - Continue thiamine supplementation. -CT brain negative for acute findings -RPR--neg - Rapid decline in mental status since 6/29 night. Please see discussion above.  Acute right subdural hematoma with midline shift, spontaneous - Likely secondary to coagulopathy from cirrhosis. Comfort care.  Transaminasemia/Hyperbilirubinemia/Decompensated hepatic cirrhosis -Defer further workup of autoantibodies to GI if they feel prudent; unclear if this has been previously worked up by Dr. Elnoria Howard in outpt setting -AFP: 3.2  -6/25 Abd US--fatty liver with possible cirrhosis, no focal hepatic lesions, splenomegaly, mild ascites, normal gallbladder - Updated Dr. Elnoria Howard regarding patient's decline and comfort care.  Acute on chronic renal failure--CKD 3 - baseline creatinine 1.4-1.7  -Secondary to volume depletion from GI losses and third spacing fluid -continue IV fluids  -Discontinue lisinopril HCTZ  -serum creatinine 2.93-->2.60 -plateaued in this range on 6/28.  - Nephrology was consulted due to concern for hepatorenal syndrome and significant non-anion gap metabolic acidosis. As per nephrology input, he may have an element of prerenal as a tenia but cannot rule out hepatorenal syndrome. Obstruction not suspected. Worsening renal functions.  Thrombocytopenia/coagulopathy/ anemia -Likely due to chronic hepatic disease and splenomegaly/cirrhosis.  -Check fibrinogen--267 -Presenting INR 1.83-->2.19 -start po vitamin K if no DVT -Monitor for signs of bleeding-no overt bleeding reported. -Baseline hemoglobin 10-11 -FOBT-positive - Hemoglobin  and platelet counts stable.   Diabetes mellitus type 2 -Hemoglobi<4.2.  -NovoLog sliding scale  -Discontinued metformin   Hyponatremia  -Secondary to hepatic cirrhosis and volume depletion -Continue to monitor  - Resolved for now.  Diarrhea -Follow up stool culture:  Negative for Escherichia coli, rest pending.  -C diff antigen positive, toxin negative--suspect colonization. As per Dr. Don Perkingat's discussion with ID, on oral Flagyl.  -now due to lactulose-reduced dose on 6/28  -12/23/15--flexi-seal inserted. Copious diarrhea.   Hypokalemia -secondary to GI loss -replete as needed and follow. -check Mag--2.3  Vomiting -continue PPI -mild lipase elevation nonspecific -advance to full liquids . Seems to have improved.   Lower extremity pain and edema -venous duplex on 6/27: Negative for DVT.   Alcohol abuse - No overt withdrawal. Apparently drank up to admission.   Non-anion gap metabolic acidosis  - Secondary to diarrhea. Management as indicated above. Bicarbonate drip started by nephrology. Follow BMP in a.m.     Disposition Plan: Expected Hospital death.  Family Communication:Discussed at length and comforted patient's family at bedside including his spouse, daughter, extended family. Code Status: DNR and full comfort care. DVT Prophylaxis: SCDs/Auto-anticoagulated    Consultants:  GI--Hung, nephrology and palliative care team Neurology CCM   Procedures: As Listed in Progress Note Above  Antibiotics: Oral Flagyl Rifaximin   Subjective: Unresponsive but comfortable. Noisy airway secretions.  Objective: Filed Vitals:   12/27/15 0738 12/27/15 0739 12/27/15 1637 12/28/15 0533  BP:   110/59 111/63  Pulse:   76 77  Temp:   98.4 F (36.9 C) 98.3 F (36.8 C)  TempSrc:   Axillary Oral  Resp:   21 14  Height:      Weight:      SpO2: 100% 100% 100% 96%    Intake/Output Summary (Last 24 hours) at 12/28/15 1052 Last data filed at 12/28/15 [id number]   Gross per 24 hour  Intake 382.13 ml  Output    950 ml  Net -567.87 ml   Weight change:  Exam:   General:  Very ill looking middle-aged male lying comfortably in bed.  HEENT: ++ icterus, No thrush, No neck mass, Miller/AT  Cardiovascular: RRR, S1/S2, no rubs, no gallops. Telemetry: Sinus rhythm.  Respiratory: reduced breath sounds in the bases but otherwise clear to auscultation. No increased work of breathing.   Abdomen: abdomen is mildly distended but soft and nontender. Ascites +. Normal bowel sounds heard. Rectal tube and Foley catheter +.  Extremities: 1 + LE edema, No lymphangitis, No petechiae, No rashes, no synovitis. Bilateral lower extremity SCDs.    CNS: Mental status as above.   Data Reviewed: I have personally reviewed following labs and imaging studies Basic Metabolic Panel:  Recent Labs Lab 11/28/2015 1719 12/23/15 0020 12/23/15 0522 12/24/15 0226 12/25/15 0526 12/26/15 0448  NA 132*  --  134* 135 137 141  K 2.2*  --  2.3* 3.4* 3.7 3.7  CL 100*  --  104 108 115* 115*  CO2 19*  --  20* 17* 13* 12*  GLUCOSE 108*  --  110* 132* 133* 151*  BUN 37*  --  36* 33* 38* 48*  CREATININE 2.93*  --  2.84* 2.60* 2.64* 2.80*  CALCIUM 9.4  --  8.7* 8.8* 8.6* 9.3  MG  --  2.3  --   --   --   --    Liver Function Tests:  Recent Labs Lab 12/15/2015 1719 12/23/15 0522 12/24/15 0226 12/25/15 0526 12/26/15 0448  AST 164* 151* 171* 179* 181*  ALT 62 54 59 70* 84*  ALKPHOS 175* 162* 174* 191* 203*  BILITOT 46.9* 39.5* 42.6* 41.7* 45.0*  PROT 6.4* 5.3* 5.5* 5.5* 6.0*  ALBUMIN 2.3* 1.9* 1.9* 2.1* 2.2*    Recent Labs  Lab 12/27/2015 1719  LIPASE 68*    Recent Labs Lab 12/17/2015 1839 12/23/15 0522 12/24/15 0226 12/26/15 0448  AMMONIA 64* 60* 66* 112*   Coagulation Profile:  Recent Labs Lab 12/25/2015 1719 12/24/15 0226 12/26/15 0448  INR 1.83* 2.19* 1.83*   CBC:  Recent Labs Lab 12/18/2015 1719 12/23/15 0522 12/23/15 1004 12/24/15 0226 12/25/15 0526  12/26/15 0448  WBC 5.4 4.6  --  4.5 7.4 16.0*  HGB 10.1* 9.3*  --  9.2* 9.4* 10.1*  HCT 28.6* 26.7* 23.7* 26.9* 28.0* 30.1*  MCV 103.6* 104.3*  --  105.5* 108.9* 112.7*  PLT 77* 64*  --  66* 85* 138*   Cardiac Enzymes:  Recent Labs Lab 12/08/2015 1839 12/23/15 0020 12/23/15 0522 12/23/15 0948  TROPONINI <0.03 <0.03 <0.03 <0.03   BNP: Invalid input(s): POCBNP CBG:  Recent Labs Lab 12/26/15 0014 12/26/15 0644 12/26/15 0819 12/26/15 1203 12/26/15 1601  GLUCAP 130* 177* 144* 121* 152*   HbA1C: No results for input(s): HGBA1C in the last 72 hours. Urine analysis:    Component Value Date/Time   COLORURINE ORANGE* 12/21/2015 2208   APPEARANCEUR CLOUDY* 12/23/2015 2208   LABSPEC 1.016 12/11/2015 2208   PHURINE 6.0 12/27/2015 2208   GLUCOSEU NEGATIVE 12/13/2015 2208   HGBUR NEGATIVE 12/04/2015 2208   BILIRUBINUR LARGE* 11/28/2015 2208   KETONESUR 15* 12/17/2015 2208   PROTEINUR NEGATIVE 12/23/2015 2208   NITRITE NEGATIVE 12/06/2015 2208   LEUKOCYTESUR SMALL* 12/09/2015 2208   Sepsis Labs: @LABRCNTIP (procalcitonin:4,lacticidven:4) ) Recent Results (from the past 240 hour(s))  MRSA PCR Screening     Status: None   Collection Time: 12/21/2015 11:48 PM  Result Value Ref Range Status   MRSA by PCR NEGATIVE NEGATIVE Final    Comment:        The GeneXpert MRSA Assay (FDA approved for NASAL specimens only), is one component of a comprehensive MRSA colonization surveillance program. It is not intended to diagnose MRSA infection nor to guide or monitor treatment for MRSA infections.   Stool culture (children & immunocomp patients)     Status: None   Collection Time: 12/23/15  4:45 AM  Result Value Ref Range Status   Salmonella/Shigella Screen Final report  Final   Campylobacter Culture Final report  Final   E coli, Shiga toxin Assay Negative Negative Final    Comment: (NOTE) Performed At: [facility] [address] [place], [place] [id number] Mila HomerHancock  William F MD QM:[id number]:[phone]   C difficile quick scan w PCR reflex     Status: Abnormal   Collection Time: 12/23/15  4:45 AM  Result Value Ref Range Status   C Diff antigen POSITIVE (A) NEGATIVE Final   C Diff toxin NEGATIVE NEGATIVE Final   C Diff interpretation   Final    C. difficile present, but toxin not detected. This indicates colonization. In most cases, this does not require treatment. If patient has signs and symptoms consistent with colitis, consider treatment. Requires ENTERIC precautions.  Clostridium Difficile by PCR     Status: Abnormal   Collection Time: 12/23/15  4:45 AM  Result Value Ref Range Status   Toxigenic C Difficile by pcr POSITIVE (A) NEGATIVE Final    Comment: CRITICAL RESULT CALLED TO, READ BACK BY AND VERIFIED WITH: Ivar Drape. SNIDER MD 15:45 12/24/15 ([email or web address])   STOOL CULTURE REFLEX - RSASHR     Status: None   Collection Time: 12/23/15  4:45 AM  Result Value Ref Range Status   Stool Culture result 1 (RSASHR) Comment  Final  Comment: (NOTE) No Salmonella or Shigella recovered. Performed At: [facility] [address] [place], [place] [id number] Mila Homer MD UV:[id number]   STOOL CULTURE Reflex - CMPCXR     Status: None   Collection Time: 12/23/15  4:45 AM  Result Value Ref Range Status   Stool Culture result 1 (CMPCXR) Comment  Final    Comment: (NOTE) No Campylobacter species isolated. Performed At: [facility] [address] [place], [place] [id number] Mila Homer MD VF:[id number]      Scheduled Meds: . atropine  4 drop Sublingual QID  . levETIRAcetam  750 mg Intravenous Q12H  . scopolamine  1 patch Transdermal Q72H  . sodium chloride flush  3 mL Intravenous Q12H   Continuous Infusions: . sodium chloride 10 mL/hr at 12/26/15 1000  . fentaNYL infusion INTRAVENOUS 100 mcg/hr (12/27/15 1653)    Procedures/Studies: Ct Head Wo Contrast  12/26/2015  ADDENDUM REPORT: 12/26/2015 10:16 ADDENDUM: Critical Value/emergent  results were called by telephone on 12/26/2015 at 10:16 am to Dr. Marcellus Scott , who verbally acknowledged these results. Electronically Signed   By: Lupita Raider, M.D.   On: 12/26/2015 10:16  12/26/2015  CLINICAL DATA:  Seizures. EXAM: CT HEAD WITHOUT CONTRAST TECHNIQUE: Contiguous axial images were obtained from the base of the skull through the vertex without intravenous contrast. COMPARISON:  CT scan of 2016/01/21. FINDINGS: Bony calvarium appears intact. There is noted large subdural hematoma overlying the right cerebral cortex containing high density material consistent with acute hemorrhage. Also noted is subdural hematoma in the right tentorial and parafalcine regions. This results in 12 mm of right to left midline shift. The maximum thickness of this hematoma is 16 mm posteriorly. Ventricular size is within normal limits. No acute infarction is noted. IMPRESSION: Large right subdural hematoma is noted resulting in 12 mm of right to left midline shift. Critical Value/emergent results were called by telephone at the time of interpretation on 12/26/2015 at 10:05 am to [facility], the patient's nurse, who verbally acknowledged these results and will contact the referring physician. My attempts so far to contact this physician were unsuccessful. Electronically Signed: By: Lupita Raider, M.D. On: 12/26/2015 10:08   Ct Head Wo Contrast  21-Jan-2016  CLINICAL DATA:  Worsening lethargy and ataxia over the past month. EXAM: CT HEAD WITHOUT CONTRAST TECHNIQUE: Contiguous axial images were obtained from the base of the skull through the vertex without intravenous contrast. COMPARISON:  05/21/2011 FINDINGS: There is no intracranial hemorrhage, mass or evidence of acute infarction. There is no extra-axial fluid collection. There is mild to moderate generalized atrophy. Gray matter and white matter are unremarkable, with normal differentiation. Visible paranasal sinuses are clear except for mild retention cyst  formation in the left maxillary sinus, unchanged. IMPRESSION: Mild generalized atrophy. No acute intracranial findings. The atrophy is worsened from 2012. Electronically Signed   By: Ellery Plunk M.D.   On: January 21, 2016 23:18   US Abdomen Complete  01/21/2016  CLINICAL DATA:  Elevated liver function studies and jaundice. EXAM: ABDOMEN ULTRASOUND COMPLETE COMPARISON:  Abdominal CT scan 05/21/2011 FINDINGS: Gallbladder: Mild gallbladder distention. No gallstones, wall thickening or pericholecystic fluid. Common bile duct: Diameter: 3.0 mm Liver: The liver contour is slightly irregular. Increased and coarse liver echogenicity with poor definition of the liver architecture and poor through transmission suggesting fatty infiltration. Cirrhosis is also possible. No focal hepatic lesions or intrahepatic biliary dilatation. IVC: Normal caliber Pancreas: Poorly visualized due to poor sonographic window. Spleen: Measures  20 x 17 x 10 cm.  Splenic volume is 1700 cubic cm. Right Kidney: Length: 15.0 cm. Normal renal cortical thickness and echogenicity without focal lesions or hydronephrosis. Left Kidney: Length: 12.8 cm. Normal renal cortical thickness and echogenicity without focal lesions or hydronephrosis. Abdominal aorta: Normal caliber Other findings: Mild ascites. IMPRESSION: 1. Suspect cirrhotic changes involving the liver and associated splenomegaly. No focal hepatic lesions or intrahepatic biliary dilatation. 2. Normal gallbladder and normal caliber common bile duct. 3. Poor visualization of the pancreas due to poor sonographic window. Electronically Signed   By: Rudie Meyer M.D.   On: 12/15/2015 19:58   Dg Chest Port 1 View  12/26/2015  CLINICAL DATA:  Known hepatic and renal failure with difficulty breathing EXAM: PORTABLE CHEST 1 VIEW COMPARISON:  02/27/2010 FINDINGS: Cardiac shadow is mildly enlarged but accentuated by the portable technique. The mediastinum is somewhat widened also likely related to the  portable technique and patient rotation to the right. Postsurgical changes in the cervical spine are seen. The lungs demonstrate no focal infiltrate or sizable effusion. No acute bony abnormality is noted. IMPRESSION: No definitive acute abnormality is seen. Electronically Signed   By: Alcide Clever M.D.   On: 12/26/2015 08:39    HONGALGI,ANAND, MD, FACP, FHM. [facility] Pager [phone]  If 7PM-7AM, please contact night-coverage [email or web address] Password [email or web address] 12/28/2015, 10:52 AM   LOS: 6 days

## 2015-12-28 DEATH — deceased

## 2015-12-29 MED ORDER — SODIUM CHLORIDE 0.9 % IV SOLN
1.0000 mg/h | INTRAVENOUS | Status: DC
  Administered 2015-12-29: 1 mg/h via INTRAVENOUS
  Filled 2015-12-29 (×2): qty 2.5

## 2015-12-29 MED ORDER — HYDROMORPHONE BOLUS VIA INFUSION
0.5000 mg | INTRAVENOUS | Status: DC | PRN
  Filled 2015-12-29: qty 1

## 2015-12-29 NOTE — Progress Notes (Signed)
Pt's wife mentioned to reporting nurse that his medicare insurance kicked in today and any insurance related questions should be directed to her

## 2015-12-29 NOTE — Progress Notes (Signed)
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 Patient was unable to provide any history secondary to his encephalopathy. Associated symptoms included nausea, poor by mouth intake, and loose stool for the past 2 months. In addition, his wife endorsed increasing lower extremity edema over this past month. The patient continued to drink " Socially"with his last alcohol drink 12/21/2015. He denied any over-the-counter supplements, herbals, or excessive acetaminophen use. In the ED, the patient was noted to have AST 164, ALT 62, phosphatase 175, total bilirubin 46.9. Ammonia was 64 with lipase 68. INR was 1.83. Patient is being managed for acute hepatic encephalopathy, decompensated hepatic cirrhosis/hepatic failure, acute on chronic kidney disease. GI and nephrology were consulted. Overall poor prognosis since admission to begin with. Overnight 6/28, waxing and waning mental status. On morning of 6/29, recurrent seizures and unresponsive between seizures. Neurology was consulted. AEDs were initiated. Stat CT head was obtained which showed large subdural hematoma with midline shift. Palliative care followed up with patient's spouse and appropriately transitioned to full comfort care and all aggressive measures were discontinued. Hospital death expected. Palliative care following and adjusting medications for full comfort/terminal care. Actively dying. Adjusted medications for terminal secretions. Palliative care has changed to Dilaudid infusion.    Assessment/Plan: Acute encephalopathy/hepatic encephalopathy -Ammonia 64 at the time of  admission-->60-->66 -Continue lactulose: Due to profuse diarrhea, after discussing with Dr. Elnoria Howard, lactulose dose was decreased to 20 mg twice daily and added rifaximin on 6/28. -Acute on chronic renal failure also contributing -Urinalysis is negative for pyuria -Check serum B12--1294 -RBC folate--905 -HIV--neg -Hepatitis B surface antigen--negative, Hepatitis C antibody--<0.1; hep A, IgM: Negative; hep B core AB, IgM: Negative - Continue thiamine supplementation. -CT brain negative for acute findings -RPR--neg - Rapid decline in mental status since 6/29 night. Please see discussion above.  Acute right subdural hematoma with midline shift, spontaneous - Likely secondary to coagulopathy from cirrhosis. Comfort care.  Transaminasemia/Hyperbilirubinemia/Decompensated hepatic cirrhosis -Defer further workup of autoantibodies to GI if they feel prudent; unclear if this has been previously worked up by Dr. Elnoria Howard in outpt setting -AFP: 3.2  -6/25 Abd US--fatty liver with possible cirrhosis, no focal hepatic lesions, splenomegaly, mild ascites, normal gallbladder - Updated Dr. Elnoria Howard regarding patient's decline and comfort care.  Acute on chronic renal failure--CKD 3 - baseline creatinine 1.4-1.7  -Secondary to volume depletion from GI losses and third spacing fluid -continue IV fluids  -Discontinue lisinopril HCTZ  -serum creatinine 2.93-->2.60 -plateaued in this range on 6/28.  - Nephrology was consulted due to concern for hepatorenal syndrome and significant non-anion gap metabolic acidosis. As per nephrology input, he may have an element of prerenal as a tenia but cannot rule out hepatorenal syndrome. Obstruction not suspected. Worsening renal functions.  Thrombocytopenia/coagulopathy/ anemia -Likely due to chronic hepatic disease and splenomegaly/cirrhosis.  -Check fibrinogen--267 -Presenting INR 1.83-->2.19 -start po vitamin K if no DVT -Monitor for signs of bleeding-no overt bleeding  reported. -Baseline hemoglobin 10-11 -FOBT-positive - Hemoglobin and platelet counts stable.   Diabetes mellitus type 2 -Hemoglobi<4.2.  -NovoLog sliding scale  -Discontinued metformin   Hyponatremia  -Secondary to hepatic cirrhosis and volume depletion -Continue to monitor  - Resolved for  now.  Diarrhea -Follow up stool culture: Negative for Escherichia coli, rest pending.  -C diff antigen positive, toxin negative--suspect colonization. As per Dr. Don Perkingat's discussion with ID, on oral Flagyl.  -now due to lactulose-reduced dose on 6/28  -12/23/15--flexi-seal inserted. Copious diarrhea.   Hypokalemia -secondary to GI loss -replete as needed and follow. -check Mag--2.3  Vomiting -continue PPI -mild lipase elevation nonspecific -advance to full liquids . Seems to have improved.   Lower extremity pain and edema -venous duplex on 6/27: Negative for DVT.   Alcohol abuse - No overt withdrawal. Apparently drank up to admission.   Non-anion gap metabolic acidosis  - Secondary to diarrhea. Management as indicated above. Bicarbonate drip started by nephrology. Follow BMP in a.m.     Disposition Plan: Expected Hospital death.  Family Communication:Discussed at length and comforted patient's family at bedside including his spouse, daughter, patient's brother and his wife. Code Status: DNR and full comfort care. DVT Prophylaxis: SCDs/Auto-anticoagulated    Consultants:  GI--[place], nephrology and palliative care team Neurology CCM   Procedures: As Listed in Progress Note Above  Antibiotics: Oral Flagyl Rifaximin   Subjective: Unresponsive but comfortable. Noisy airway secretions-better than yesterday.  Objective: Filed Vitals:   12/27/15 1637 12/28/15 0533 12/29/15 0527 12/29/15 0801  BP: 110/59 111/63 97/45   Pulse: 76 77 83   Temp: 98.4 F (36.9 C) 98.3 F (36.8 C) 99.2 F (37.3 C) 100.9 F (38.3 C)  TempSrc: Axillary Oral Oral Oral  Resp: 21 14  12    Height:      Weight:      SpO2: 100% 96% 92%     Intake/Output Summary (Last 24 hours) at 12/29/15 1023 Last data filed at 12/29/15 [id number]  Gross per 24 hour  Intake      0 ml  Output    450 ml  Net   -450 ml   Weight change:  Exam:   General:  Very ill looking middle-aged male lying comfortably in bed.  HEENT: ++ icterus, No thrush, No neck mass, [place]/AT  Cardiovascular: RRR, S1/S2, no rubs, no gallops. Telemetry: Sinus rhythm.  Respiratory: reduced breath sounds in the bases but otherwise clear to auscultation. No increased work of breathing.   Abdomen: abdomen is mildly distended but soft and nontender. Ascites +. Normal bowel sounds heard. Rectal tube and Foley catheter +.  Extremities: 1 + LE edema, No lymphangitis, No petechiae, No rashes, no synovitis. Bilateral lower extremity SCDs.    CNS: Mental status as above.   Data Reviewed: I have personally reviewed following labs and imaging studies Basic Metabolic Panel:  Recent Labs Lab 12/06/2015 1719 12/23/15 0020 12/23/15 0522 12/24/15 0226 12/25/15 0526 12/26/15 0448  NA 132*  --  134* 135 137 141  K 2.2*  --  2.3* 3.4* 3.7 3.7  CL 100*  --  104 108 115* 115*  CO2 19*  --  20* 17* 13* 12*  GLUCOSE 108*  --  110* 132* 133* 151*  BUN 37*  --  36* 33* 38* 48*  CREATININE 2.93*  --  2.84* 2.60* 2.64* 2.80*  CALCIUM 9.4  --  8.7* 8.8* 8.6* 9.3  MG  --  2.3  --   --   --   --    Liver Function Tests:  Recent Labs Lab 12/25/2015 1719 12/23/15 0522 12/24/15 0226 12/25/15 0526 12/26/15 0448  AST 164* 151* 171* 179* 181*  ALT 62 54 59 70* 84*  ALKPHOS 175* 162* 174* 191* 203*  BILITOT 46.9* 39.5* 42.6* 41.7* 45.0*  PROT 6.4* 5.3* 5.5* 5.5* 6.0*  ALBUMIN 2.3* 1.9* 1.9* 2.1* 2.2*    Recent Labs Lab 12/14/2015 1719  LIPASE 68*    Recent Labs Lab 12/16/2015 1839 12/23/15 0522 12/24/15 0226 12/26/15 0448  AMMONIA 64* 60* 66* 112*   Coagulation Profile:  Recent Labs Lab 12/01/2015 1719  12/24/15 0226 12/26/15 0448  INR 1.83* 2.19* 1.83*   CBC:  Recent Labs Lab 12/09/2015 1719 12/23/15 0522 12/23/15 1004 12/24/15 0226 12/25/15 0526 12/26/15 0448  WBC 5.4 4.6  --  4.5 7.4 16.0*  HGB 10.1* 9.3*  --  9.2* 9.4* 10.1*  HCT 28.6* 26.7* 23.7* 26.9* 28.0* 30.1*  MCV 103.6* 104.3*  --  105.5* 108.9* 112.7*  PLT 77* 64*  --  66* 85* 138*   Cardiac Enzymes:  Recent Labs Lab 12/08/2015 1839 12/23/15 0020 12/23/15 0522 12/23/15 0948  TROPONINI <0.03 <0.03 <0.03 <0.03   BNP: Invalid input(s): POCBNP CBG:  Recent Labs Lab 12/26/15 0014 12/26/15 0644 12/26/15 0819 12/26/15 1203 12/26/15 1601  GLUCAP 130* 177* 144* 121* 152*   HbA1C: No results for input(s): HGBA1C in the last 72 hours. Urine analysis:    Component Value Date/Time   COLORURINE ORANGE* 12/17/2015 2208   APPEARANCEUR CLOUDY* 11/28/2015 2208   LABSPEC 1.016 12/16/2015 2208   PHURINE 6.0 12/03/2015 2208   GLUCOSEU NEGATIVE 12/08/2015 2208   HGBUR NEGATIVE 12/23/2015 2208   BILIRUBINUR LARGE* 12/10/2015 2208   KETONESUR 15* 12/14/2015 2208   PROTEINUR NEGATIVE 12/07/2015 2208   NITRITE NEGATIVE 12/01/2015 2208   LEUKOCYTESUR SMALL* 12/08/2015 2208   Sepsis Labs: @LABRCNTIP (procalcitonin:4,lacticidven:4) ) Recent Results (from the past 240 hour(s))  MRSA PCR Screening     Status: None   Collection Time: 11/28/2015 11:48 PM  Result Value Ref Range Status   MRSA by PCR NEGATIVE NEGATIVE Final    Comment:        The GeneXpert MRSA Assay (FDA approved for NASAL specimens only), is one component of a comprehensive MRSA colonization surveillance program. It is not intended to diagnose MRSA infection nor to guide or monitor treatment for MRSA infections.   Stool culture (children & immunocomp patients)     Status: None   Collection Time: 12/23/15  4:45 AM  Result Value Ref Range Status   Salmonella/Shigella Screen Final report  Final   Campylobacter Culture Final report  Final   E  coli, Shiga toxin Assay Negative Negative Final    Comment: (NOTE) Performed At: [facility] [address] [place], [place] [id number] Mila HomerHancock William F MD WU:[id number]:[phone]   C difficile quick scan w PCR reflex     Status: Abnormal   Collection Time: 12/23/15  4:45 AM  Result Value Ref Range Status   C Diff antigen POSITIVE (A) NEGATIVE Final   C Diff toxin NEGATIVE NEGATIVE Final   C Diff interpretation   Final    C. difficile present, but toxin not detected. This indicates colonization. In most cases, this does not require treatment. If patient has signs and symptoms consistent with colitis, consider treatment. Requires ENTERIC precautions.  Clostridium Difficile by PCR     Status: Abnormal   Collection Time: 12/23/15  4:45 AM  Result Value Ref Range Status   Toxigenic C Difficile by pcr POSITIVE (A) NEGATIVE Final    Comment: CRITICAL RESULT CALLED TO, READ BACK BY AND VERIFIED WITH: Ivar Drape. SNIDER MD 15:45 12/24/15 ([email or web address])   STOOL CULTURE REFLEX - RSASHR     Status:  None   Collection Time: 12/23/15  4:45 AM  Result Value Ref Range Status   Stool Culture result 1 (RSASHR) Comment  Final    Comment: (NOTE) No Salmonella or Shigella recovered. Performed At: [facility] [address] [place], [place] [id number] Mila Homer MD NF:[id number]   STOOL CULTURE Reflex - CMPCXR     Status: None   Collection Time: 12/23/15  4:45 AM  Result Value Ref Range Status   Stool Culture result 1 (CMPCXR) Comment  Final    Comment: (NOTE) No Campylobacter species isolated. Performed At: [facility] [address] [place], [place] [id number] Mila Homer MD UX:[id number]      Scheduled Meds: . atropine  4 drop Sublingual QID  . levETIRAcetam  750 mg Intravenous Q12H  . scopolamine  1 patch Transdermal Q72H  . sodium chloride flush  3 mL Intravenous Q12H   Continuous Infusions: . sodium chloride 10 mL/hr at 12/26/15 1000  . HYDROmorphone       Procedures/Studies: Ct Head Wo Contrast  12/26/2015  ADDENDUM REPORT: 12/26/2015 10:16 ADDENDUM: Critical Value/emergent results were called by telephone on 12/26/2015 at 10:16 am to Dr. Marcellus Scott , who verbally acknowledged these results. Electronically Signed   By: Lupita Raider, M.D.   On: 12/26/2015 10:16  12/26/2015  CLINICAL DATA:  Seizures. EXAM: CT HEAD WITHOUT CONTRAST TECHNIQUE: Contiguous axial images were obtained from the base of the skull through the vertex without intravenous contrast. COMPARISON:  CT scan of 2016-01-07. FINDINGS: Bony calvarium appears intact. There is noted large subdural hematoma overlying the right cerebral cortex containing high density material consistent with acute hemorrhage. Also noted is subdural hematoma in the right tentorial and parafalcine regions. This results in 12 mm of right to left midline shift. The maximum thickness of this hematoma is 16 mm posteriorly. Ventricular size is within normal limits. No acute infarction is noted. IMPRESSION: Large right subdural hematoma is noted resulting in 12 mm of right to left midline shift. Critical Value/emergent results were called by telephone at the time of interpretation on 12/26/2015 at 10:05 am to [facility], the patient's nurse, who verbally acknowledged these results and will contact the referring physician. My attempts so far to contact this physician were unsuccessful. Electronically Signed: By: Lupita Raider, M.D. On: 12/26/2015 10:08   Ct Head Wo Contrast  January 07, 2016  CLINICAL DATA:  Worsening lethargy and ataxia over the past month. EXAM: CT HEAD WITHOUT CONTRAST TECHNIQUE: Contiguous axial images were obtained from the base of the skull through the vertex without intravenous contrast. COMPARISON:  05/21/2011 FINDINGS: There is no intracranial hemorrhage, mass or evidence of acute infarction. There is no extra-axial fluid collection. There is mild to moderate generalized atrophy. Gray matter and  white matter are unremarkable, with normal differentiation. Visible paranasal sinuses are clear except for mild retention cyst formation in the left maxillary sinus, unchanged. IMPRESSION: Mild generalized atrophy. No acute intracranial findings. The atrophy is worsened from 2012. Electronically Signed   By: Ellery Plunk M.D.   On: 01/07/16 23:18   US Abdomen Complete  2016/01/07  CLINICAL DATA:  Elevated liver function studies and jaundice. EXAM: ABDOMEN ULTRASOUND COMPLETE COMPARISON:  Abdominal CT scan 05/21/2011 FINDINGS: Gallbladder: Mild gallbladder distention. No gallstones, wall thickening or pericholecystic fluid. Common bile duct: Diameter: 3.0 mm Liver: The liver contour is slightly irregular. Increased and coarse liver echogenicity with poor definition of the liver architecture and poor through transmission suggesting fatty infiltration. Cirrhosis  is also possible. No focal hepatic lesions or intrahepatic biliary dilatation. IVC: Normal caliber Pancreas: Poorly visualized due to poor sonographic window. Spleen: Measures 20 x 17 x 10 cm.  Splenic volume is 1700 cubic cm. Right Kidney: Length: 15.0 cm. Normal renal cortical thickness and echogenicity without focal lesions or hydronephrosis. Left Kidney: Length: 12.8 cm. Normal renal cortical thickness and echogenicity without focal lesions or hydronephrosis. Abdominal aorta: Normal caliber Other findings: Mild ascites. IMPRESSION: 1. Suspect cirrhotic changes involving the liver and associated splenomegaly. No focal hepatic lesions or intrahepatic biliary dilatation. 2. Normal gallbladder and normal caliber common bile duct. 3. Poor visualization of the pancreas due to poor sonographic window. Electronically Signed   By: Rudie Meyer M.D.   On: 01/10/2016 19:58   Dg Chest Port 1 View  12/26/2015  CLINICAL DATA:  Known hepatic and renal failure with difficulty breathing EXAM: PORTABLE CHEST 1 VIEW COMPARISON:  02/27/2010 FINDINGS: Cardiac  shadow is mildly enlarged but accentuated by the portable technique. The mediastinum is somewhat widened also likely related to the portable technique and patient rotation to the right. Postsurgical changes in the cervical spine are seen. The lungs demonstrate no focal infiltrate or sizable effusion. No acute bony abnormality is noted. IMPRESSION: No definitive acute abnormality is seen. Electronically Signed   By: Alcide Clever M.D.   On: 12/26/2015 08:39    Rollo Farquhar, MD, FACP, FHM. [facility] Pager [phone]  If 7PM-7AM, please contact night-coverage [email or web address] Password [email or web address] 12/29/2015, 10:23 AM   LOS: 7 days

## 2015-12-29 NOTE — Progress Notes (Signed)
Palliative update: Call received from [facility] regarding fentanyl infusion. At times, the patient is in distress and appears to have nonverbal gestures of pain. He is requiring some Ativan when necessary. Patient has renal failure and codeine allergy hence initially fentanyl was chosen. We will switch to Dilaudid infusion this morning and monitor. New orders placed. Continue secretion management. Continue other end-of-life measures. Prognosis remains hours-days and anticipate hospital death.  Rosalin HawkingZeba Destiney Sanabia MD [facility] team [phone]

## 2015-12-29 NOTE — Progress Notes (Signed)
Dilaudid drip initiated at 682ml/hr as ordered. Explained to pt's family prior to starting infusion

## 2015-12-29 NOTE — Progress Notes (Signed)
80mL of Fentayl wasted with [employer]unu.

## 2015-12-29 NOTE — Progress Notes (Signed)
Paged palliative care MD as advised by hospitalist and discussed changing iv fentanyl to morphine. Palliative care MD recommended initiating a dilaudid drip due to the pt's renal function. MD will make the order changes as discussed. hospitalist notified of changes and is in agreement

## 2015-12-29 NOTE — Progress Notes (Signed)
80cc of fentanyl wasted with RN Kenyon Anaakita

## 2015-12-30 LAB — LACTOFERRIN, FECAL, QUANT.: Lactoferrin, Fecal, Quant.: 3.16 ug/mL(g) (ref 0.00–7.24)

## 2016-01-28 NOTE — Discharge Summary (Addendum)
[name]:[id number] DOB: 12-Sep-1965   Death summary    PCP: [name], MD  Admit date: 2016/01/19 Date of Death: 27-Jan-2016 Time of Death: 0014 hours Notification: [name], MD notified of death of January 27, 2016 by forwarding death summary.   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 Patient was unable to provide any history secondary to his encephalopathy. Associated symptoms included nausea, poor by mouth intake, and loose stool for the past 2 months. In addition, his wife endorsed increasing lower extremity edema over this past month. The patient continued to drink " Socially"with his last alcohol drink 12/21/2015. He denied any over-the-counter supplements, herbals, or excessive acetaminophen use. In the ED, the patient was noted to have AST 164, ALT 62, phosphatase 175, total bilirubin 46.9. Ammonia was 64 with lipase 68. INR was 1.83. Patient was managed for acute hepatic encephalopathy, decompensated hepatic cirrhosis/hepatic failure, acute on stage III chronic kidney disease. GI and nephrology were consulted. Overall poor prognosis since admission to begin with. Overnight 6/28, waxing and waning mental status. On morning of 6/29, recurrent seizures and unresponsive between seizures. Neurology was consulted. AEDs were initiated. Stat CT head was obtained which showed large right subdural hematoma with midline shift-likely spontaneous related to coagulopathy from cirrhosis. Palliative care followed up with patient's spouse and appropriately transitioned to full comfort care and all aggressive measures were discontinued. He was DO NOT RESUSCITATE. Patient died peacefully on 01-27-16 at 12:14 AM from multiorgan failure  related to underlying hepatic cirrhosis and acute hepatic failure. Hospital course is outlined as below.   Final Diagnoses:   Multiorgan failure  Acute encephalopathy/hepatic encephalopathy -Ammonia 64 at the time of admission-->60-->66 -Continue lactulose: Due to profuse diarrhea, after discussing with Dr. Elnoria Howard, lactulose dose was decreased to 20 mg twice daily and added rifaximin on 6/28. -Acute on chronic renal failure also contributing -Urinalysis is negative for pyuria -Check serum B12--1294 -RBC folate--905 -HIV--neg -Hepatitis B surface antigen--negative, Hepatitis C antibody--<0.1; hep A, IgM: Negative; hep B core AB, IgM: Negative - Continue thiamine supplementation. -CT brain negative for acute findings -RPR--neg - Rapid decline in mental status since 6/29 night. Please see discussion above.  Acute right subdural hematoma with midline shift, spontaneous - Likely secondary to coagulopathy from cirrhosis. Comfort care.  Transaminasemia/Hyperbilirubinemia/Decompensated hepatic cirrhosis -Defer further workup of autoantibodies to GI if they feel prudent; unclear if this has been previously worked up by Dr. Elnoria Howard in outpt setting -AFP: 3.2  -19-Jan-2023 Abd US--fatty liver with possible cirrhosis, no focal hepatic lesions, splenomegaly, mild ascites, normal gallbladder - Updated Dr. Elnoria Howard regarding patient's decline and comfort care.  Acute on chronic renal failure--CKD 3 - baseline creatinine 1.4-1.7  -Secondary to volume depletion from GI losses and third spacing fluid -continue IV fluids  -Discontinue lisinopril HCTZ  -serum creatinine 2.93-->2.60 -plateaued in this range on 6/28.  - Nephrology was consulted due to concern for hepatorenal syndrome and significant non-anion gap metabolic acidosis. As per nephrology input, he may have an element of prerenal as a tenia but cannot rule out hepatorenal syndrome. Obstruction not suspected. Worsening renal  functions.  Thrombocytopenia/coagulopathy/ anemia -Likely due to chronic hepatic disease and splenomegaly/cirrhosis.  -Check fibrinogen--267 -Presenting INR 1.83-->2.19 -start po vitamin K if no DVT -Monitor for signs of bleeding-no overt bleeding reported. -Baseline hemoglobin 10-11 -FOBT-positive -  Hemoglobin and platelet counts stable.   Diabetes mellitus type 2 -Hemoglobi<4.2.  -NovoLog sliding scale  -Discontinued metformin   Hyponatremia  -Secondary to hepatic cirrhosis and volume depletion -Continue to monitor  - Resolved for now.  Diarrhea -Follow up stool culture: Negative for Escherichia coli, rest pending.  -C diff antigen positive, toxin negative--suspect colonization. As per Dr. Don Perkingat's discussion with ID, on oral Flagyl.  -now due to lactulose-reduced dose on 6/28  -12/23/15--flexi-seal inserted. Copious diarrhea.   Hypokalemia -secondary to GI loss -replete as needed and follow. -check Mag--2.3  Vomiting -continue PPI -mild lipase elevation nonspecific -advance to full liquids . Seems to have improved.   Lower extremity pain and edema -venous duplex on 6/27: Negative for DVT.   Alcohol abuse - No overt withdrawal. Apparently drank up to admission.   Non-anion gap metabolic acidosis  - Secondary to diarrhea. Management as indicated above. Bicarbonate drip started by nephrology. Follow BMP in a.m.   Consultants:  GI--[place], nephrology and palliative care team Neurology CCM   The results of significant diagnostics from this hospitalization (including imaging, microbiology, ancillary and laboratory) are listed below for reference.    Significant Diagnostic Studies: Ct Head Wo Contrast  12/26/2015  ADDENDUM REPORT: 12/26/2015 10:16 ADDENDUM: Critical Value/emergent results were called by telephone on 12/26/2015 at 10:16 am to Dr. Marcellus ScottANAND Jenifer Struve , who verbally acknowledged these results. Electronically Signed   By: Lupita RaiderJames  Green Jr, M.D.   On:  12/26/2015 10:16  12/26/2015  CLINICAL DATA:  Seizures. EXAM: CT HEAD WITHOUT CONTRAST TECHNIQUE: Contiguous axial images were obtained from the base of the skull through the vertex without intravenous contrast. COMPARISON:  CT scan of December 22, 2015. FINDINGS: Bony calvarium appears intact. There is noted large subdural hematoma overlying the right cerebral cortex containing high density material consistent with acute hemorrhage. Also noted is subdural hematoma in the right tentorial and parafalcine regions. This results in 12 mm of right to left midline shift. The maximum thickness of this hematoma is 16 mm posteriorly. Ventricular size is within normal limits. No acute infarction is noted. IMPRESSION: Large right subdural hematoma is noted resulting in 12 mm of right to left midline shift. Critical Value/emergent results were called by telephone at the time of interpretation on 12/26/2015 at 10:05 am to [facility], the patient's nurse, who verbally acknowledged these results and will contact the referring physician. My attempts so far to contact this physician were unsuccessful. Electronically Signed: By: Lupita RaiderJames  Green Jr, M.D. On: 12/26/2015 10:08   Ct Head Wo Contrast  12-Nov-2015  CLINICAL DATA:  Worsening lethargy and ataxia over the past month. EXAM: CT HEAD WITHOUT CONTRAST TECHNIQUE: Contiguous axial images were obtained from the base of the skull through the vertex without intravenous contrast. COMPARISON:  05/21/2011 FINDINGS: There is no intracranial hemorrhage, mass or evidence of acute infarction. There is no extra-axial fluid collection. There is mild to moderate generalized atrophy. Gray matter and white matter are unremarkable, with normal differentiation. Visible paranasal sinuses are clear except for mild retention cyst formation in the left maxillary sinus, unchanged. IMPRESSION: Mild generalized atrophy. No acute intracranial findings. The atrophy is worsened from 2012. Electronically Signed   By:  Ellery Plunkaniel R Mitchell M.D.   On: 016-May-2017 23:18   [place] Abdomen Complete  12-Nov-2015  CLINICAL DATA:  Elevated liver function studies and jaundice. EXAM: ABDOMEN ULTRASOUND COMPLETE COMPARISON:  Abdominal CT scan 05/21/2011 FINDINGS: Gallbladder: Mild gallbladder distention. No gallstones, wall thickening or pericholecystic fluid. Common bile duct: Diameter: 3.0  mm Liver: The liver contour is slightly irregular. Increased and coarse liver echogenicity with poor definition of the liver architecture and poor through transmission suggesting fatty infiltration. Cirrhosis is also possible. No focal hepatic lesions or intrahepatic biliary dilatation. IVC: Normal caliber Pancreas: Poorly visualized due to poor sonographic window. Spleen: Measures 20 x 17 x 10 cm.  Splenic volume is 1700 cubic cm. Right Kidney: Length: 15.0 cm. Normal renal cortical thickness and echogenicity without focal lesions or hydronephrosis. Left Kidney: Length: 12.8 cm. Normal renal cortical thickness and echogenicity without focal lesions or hydronephrosis. Abdominal aorta: Normal caliber Other findings: Mild ascites. IMPRESSION: 1. Suspect cirrhotic changes involving the liver and associated splenomegaly. No focal hepatic lesions or intrahepatic biliary dilatation. 2. Normal gallbladder and normal caliber common bile duct. 3. Poor visualization of the pancreas due to poor sonographic window. Electronically Signed   By: Rudie Meyer M.D.   On: 12/21/2015 19:58   Dg Chest Port 1 View  12/26/2015  CLINICAL DATA:  Known hepatic and renal failure with difficulty breathing EXAM: PORTABLE CHEST 1 VIEW COMPARISON:  02/27/2010 FINDINGS: Cardiac shadow is mildly enlarged but accentuated by the portable technique. The mediastinum is somewhat widened also likely related to the portable technique and patient rotation to the right. Postsurgical changes in the cervical spine are seen. The lungs demonstrate no focal infiltrate or sizable effusion. No acute  bony abnormality is noted. IMPRESSION: No definitive acute abnormality is seen. Electronically Signed   By: Alcide Clever M.D.   On: 12/26/2015 08:39    Microbiology: Recent Results (from the past 240 hour(s))  MRSA PCR Screening     Status: None   Collection Time: 11/30/2015 11:48 PM  Result Value Ref Range Status   MRSA by PCR NEGATIVE NEGATIVE Final    Comment:        The GeneXpert MRSA Assay (FDA approved for NASAL specimens only), is one component of a comprehensive MRSA colonization surveillance program. It is not intended to diagnose MRSA infection nor to guide or monitor treatment for MRSA infections.   Stool culture (children & immunocomp patients)     Status: None   Collection Time: 12/23/15  4:45 AM  Result Value Ref Range Status   Salmonella/Shigella Screen Final report  Final   Campylobacter Culture Final report  Final   E coli, Shiga toxin Assay Negative Negative Final    Comment: (NOTE) Performed At: [facility] [address] [place], [place] [id number] Mila Homer MD [id number]   C difficile quick scan w PCR reflex     Status: Abnormal   Collection Time: 12/23/15  4:45 AM  Result Value Ref Range Status   C Diff antigen POSITIVE (A) NEGATIVE Final   C Diff toxin NEGATIVE NEGATIVE Final   C Diff interpretation   Final    C. difficile present, but toxin not detected. This indicates colonization. In most cases, this does not require treatment. If patient has signs and symptoms consistent with colitis, consider treatment. Requires ENTERIC precautions.  Clostridium Difficile by PCR     Status: Abnormal   Collection Time: 12/23/15  4:45 AM  Result Value Ref Range Status   Toxigenic C Difficile by pcr POSITIVE (A) NEGATIVE Final    Comment: CRITICAL RESULT CALLED TO, READ BACK BY AND VERIFIED WITH: Ivar Drape MD 15:45 12/24/15 ([email or web address])   STOOL CULTURE REFLEX - RSASHR     Status: None   Collection Time: 12/23/15  4:45 AM  Result Value Ref Range  Status  Stool Culture result 1 (RSASHR) Comment  Final    Comment: (NOTE) No Salmonella or Shigella recovered. Performed At: [facility] [address] [place], [place] [id number] Mila HomerHancock William F MD WU:[id number]:[phone]   STOOL CULTURE Reflex - CMPCXR     Status: None   Collection Time: 12/23/15  4:45 AM  Result Value Ref Range Status   Stool Culture result 1 (CMPCXR) Comment  Final    Comment: (NOTE) No Campylobacter species isolated. Performed At: [facility] [address] [place], [place] [id number] Mila HomerHancock William F MD VH:[id number]:[phone]      Labs: Basic Metabolic Panel:  Recent Labs Lab 12/24/15 0226 12/25/15 0526 12/26/15 0448  NA 135 137 141  K 3.4* 3.7 3.7  CL 108 115* 115*  CO2 17* 13* 12*  GLUCOSE 132* 133* 151*  BUN 33* 38* 48*  CREATININE 2.60* 2.64* 2.80*  CALCIUM 8.8* 8.6* 9.3   Liver Function Tests:  Recent Labs Lab 12/24/15 0226 12/25/15 0526 12/26/15 0448  AST 171* 179* 181*  ALT 59 70* 84*  ALKPHOS 174* 191* 203*  BILITOT 42.6* 41.7* 45.0*  PROT 5.5* 5.5* 6.0*  ALBUMIN 1.9* 2.1* 2.2*   No results for input(s): LIPASE, AMYLASE in the last 168 hours.  Recent Labs Lab 12/24/15 0226 12/26/15 0448  AMMONIA 66* 112*   CBC:  Recent Labs Lab 12/24/15 0226 12/25/15 0526 12/26/15 0448  WBC 4.5 7.4 16.0*  HGB 9.2* 9.4* 10.1*  HCT 26.9* 28.0* 30.1*  MCV 105.5* 108.9* 112.7*  PLT 66* 85* 138*   Cardiac Enzymes: No results for input(s): CKTOTAL, CKMB, CKMBINDEX, TROPONINI in the last 168 hours. D-Dimer No results for input(s): DDIMER in the last 72 hours. BNP: Invalid input(s): POCBNP CBG:  Recent Labs Lab 12/26/15 0014 12/26/15 0644 12/26/15 0819 12/26/15 1203 12/26/15 1601  GLUCAP 130* 177* 144* 121* 152*   Anemia work up No results for input(s): VITAMINB12, FOLATE, FERRITIN, TIBC, IRON, RETICCTPCT in the last 72 hours. Urinalysis    Component Value Date/Time   COLORURINE ORANGE* 12/19/2015 2208    APPEARANCEUR CLOUDY* 12/03/2015 2208   LABSPEC 1.016 12/23/2015 2208   PHURINE 6.0 12/26/2015 2208   GLUCOSEU NEGATIVE 12/05/2015 2208   HGBUR NEGATIVE 11/29/2015 2208   BILIRUBINUR LARGE* 12/17/2015 2208   KETONESUR 15* 12/05/2015 2208   PROTEINUR NEGATIVE 12/02/2015 2208   NITRITE NEGATIVE 12/11/2015 2208   LEUKOCYTESUR SMALL* 12/14/2015 2208   Sepsis Labs Invalid input(s): PROCALCITONIN,  WBC,  LACTICIDVEN     SIGNED:  Marcellus ScottHONGALGI,Brendia Dampier, MD, FACP, FHM. [facility] Pager [phone]  If 7PM-7AM, please contact night-coverage [email or web address] Password [email or web address] 01/06/2016, 6:02 PM

## 2016-01-28 NOTE — Progress Notes (Signed)
Patient time of death [id number]. Wife notified family chose not to come to hospital. [facility] notified and eyes were prepped for possible donation. Body prep was completed and post mortem  Flow sheet finished.  Pt was taken to morgue by Nurse Tech's. Bed placement notified.  Cindee SaltMcBride,Tiburcio Linder K, RN

## 2016-01-28 DEATH — deceased

## 2017-07-02 IMAGING — CT CT HEAD W/O CM
4 series · 16 of 47 positions shown, 18 images · non-contrast
Comparison: 05/21/2011

CLINICAL DATA: Worsening lethargy and ataxia over the past month.

EXAM:
CT HEAD WITHOUT CONTRAST
TECHNIQUE: Contiguous axial images were obtained from the base of the skull
through the vertex without intravenous contrast.

[Series 2: head without · axial · non-contrast · 0.54mm/px · z∈[+1337,+1457]mm · 7 of 34 slices shown, 9 images]
[im 5/34  brain]
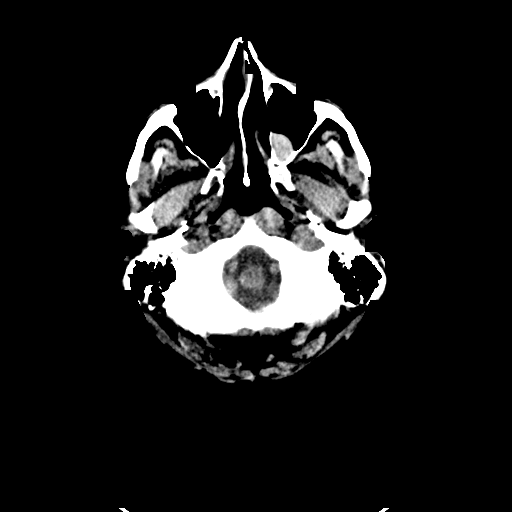
[im 5/34  bone]
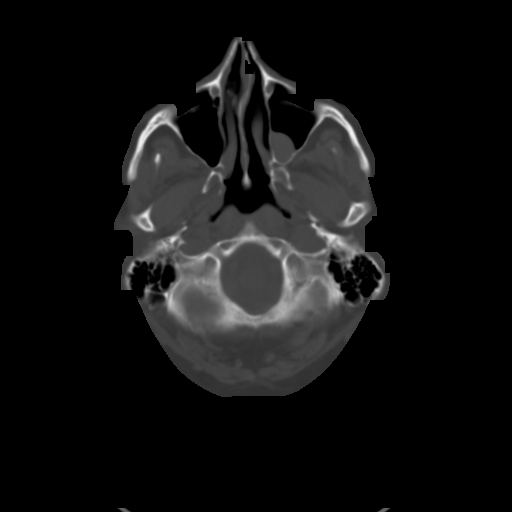
[im 9/34  brain]
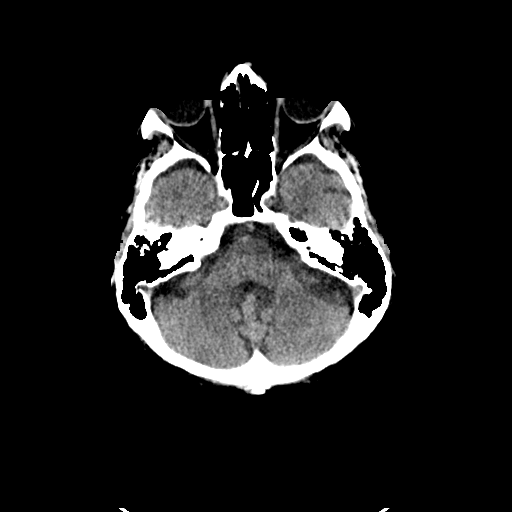
[im 13/34  brain]
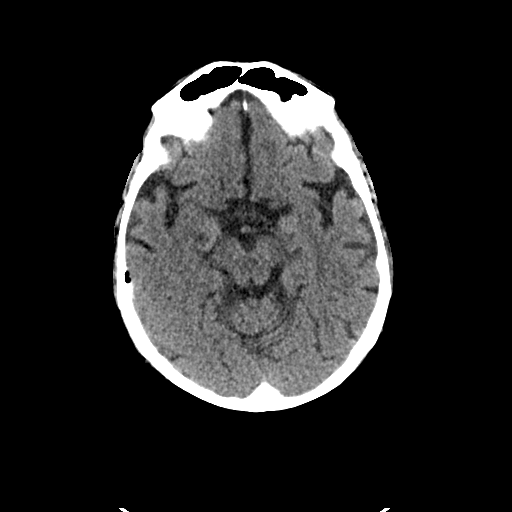
[im 17/34  brain]
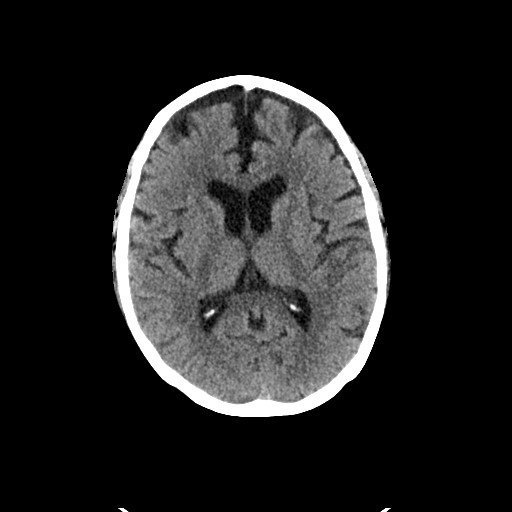
[im 21/34  brain]
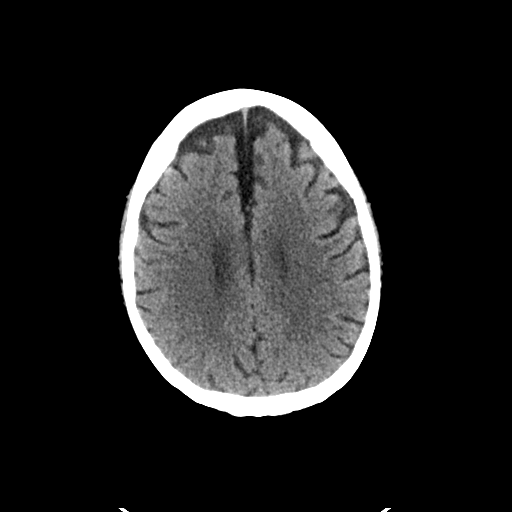
[im 21/34  bone]
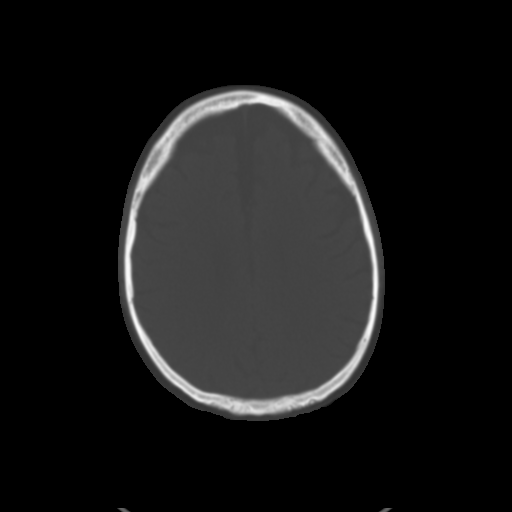
[im 25/34  brain]
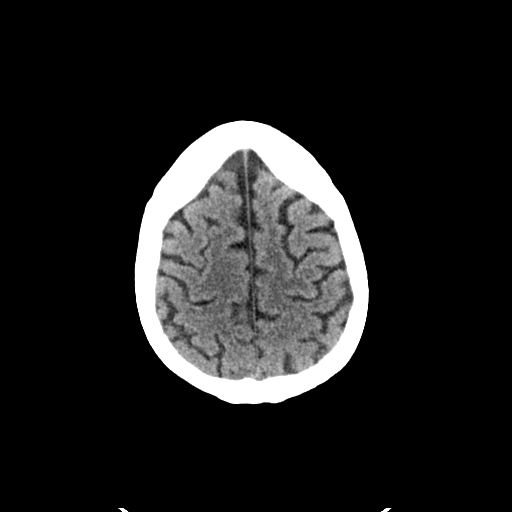
[im 29/34  brain]
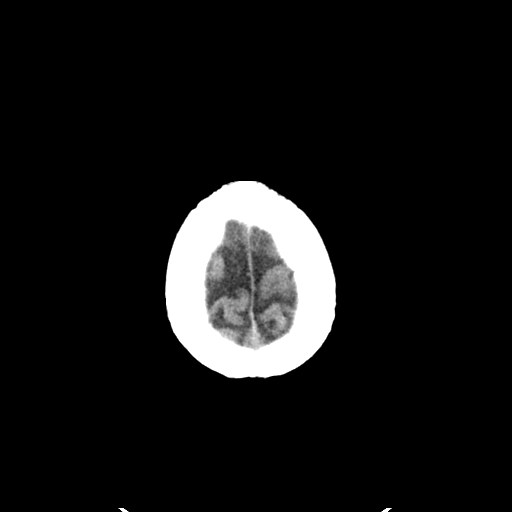

[Series 3: head bone · axial · 0.54mm/px · z∈[+1333,+1367]mm · 3 of 85 slices shown]
[im 9/85  bone]
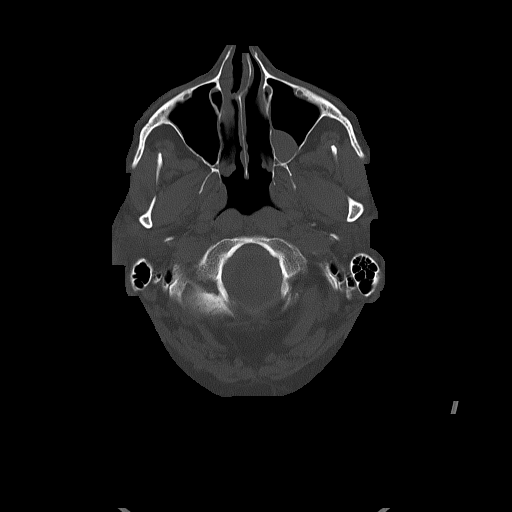
[im 17/85  bone]
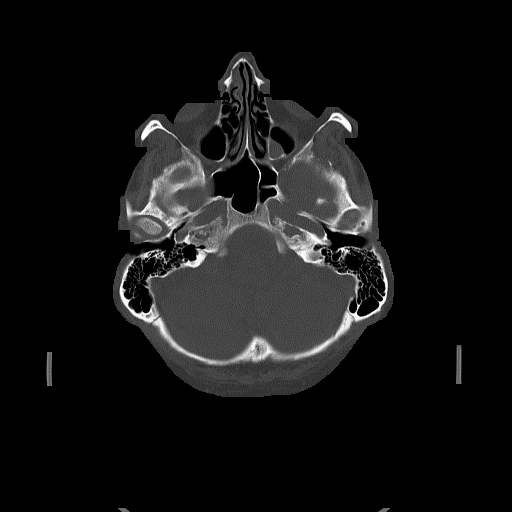
[im 26/85  bone]
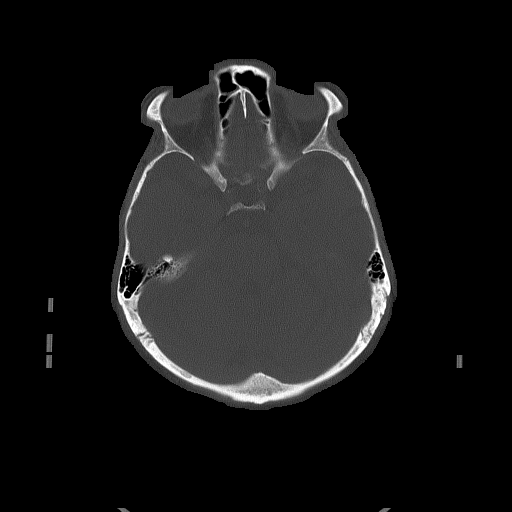

[Series 4: head without cor · coronal · non-contrast · 0.33mm/px · 3 of 72 slices shown]
[im 24/72  brain]
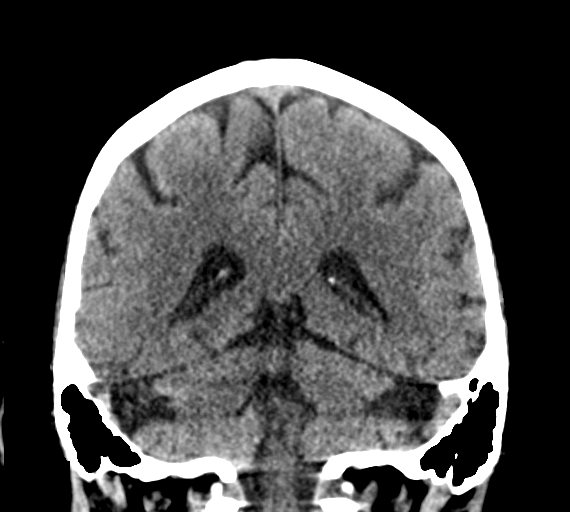
[im 32/72  brain]
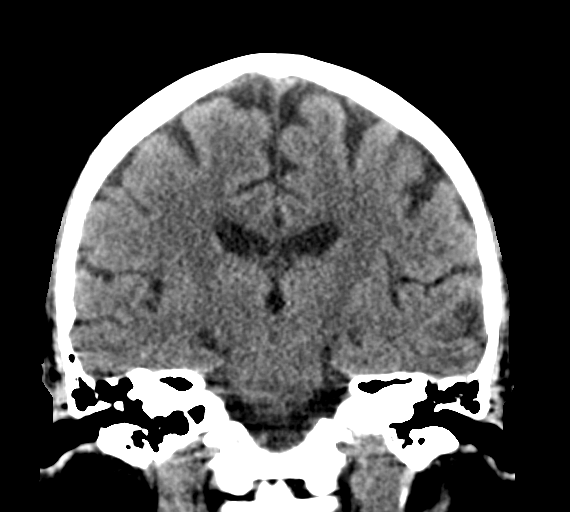
[im 40/72  brain]
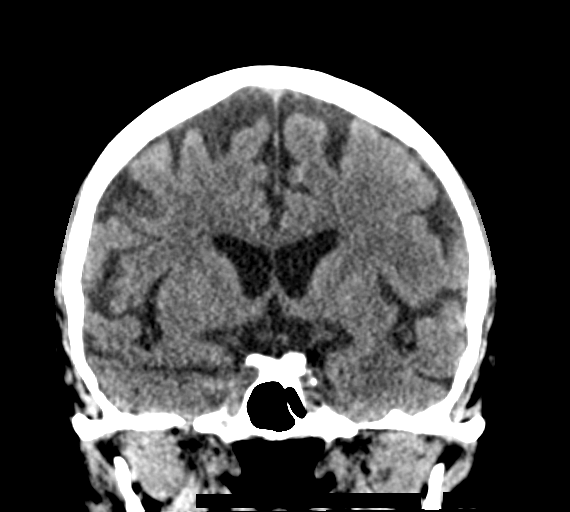

[Series 5: head without sag · sagittal · non-contrast · 0.33mm/px · 3 of 65 slices shown]
[im 22/65  brain]
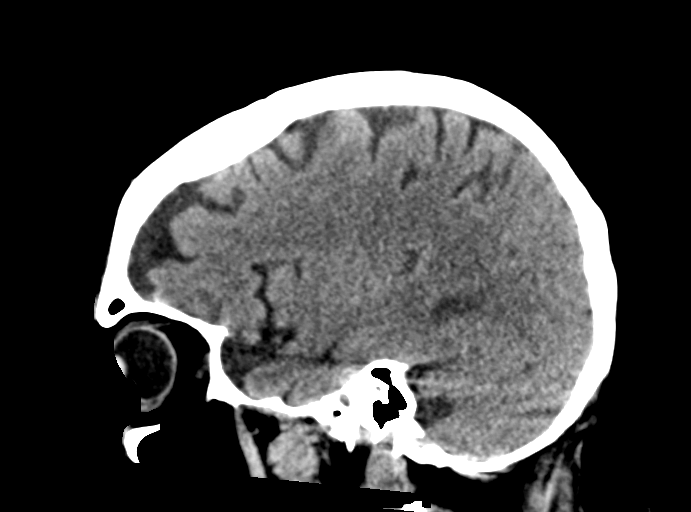
[im 33/65  brain]
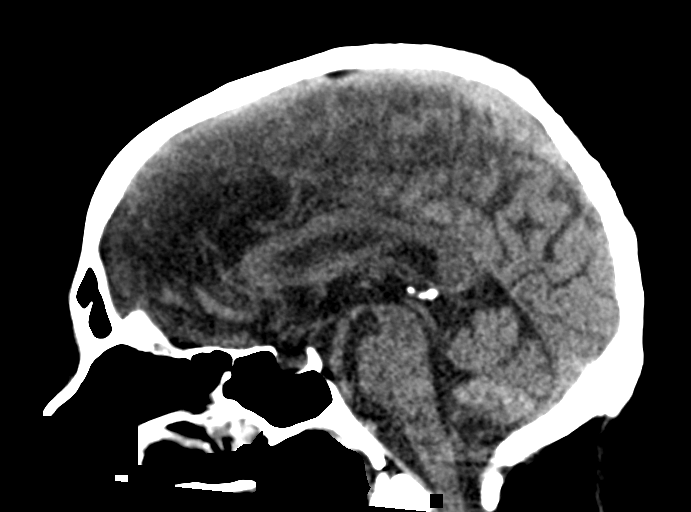
[im 43/65  brain]
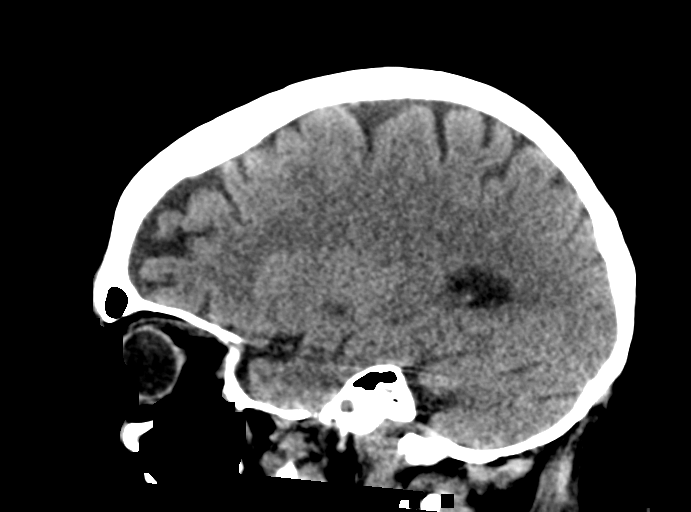

[16 of 47 positions shown; findings below may reference images not displayed]

FINDINGS: There is no intracranial hemorrhage, mass or evidence of acute
infarction. There is no extra-axial fluid collection. There is mild
to moderate generalized atrophy. Gray matter and white matter are
unremarkable, with normal differentiation. Visible paranasal sinuses
are clear except for mild retention cyst formation in the left
maxillary sinus, unchanged.
IMPRESSION: Mild generalized atrophy. No acute intracranial findings. The
atrophy is worsened from 8778.

## 2017-07-06 IMAGING — CR DG CHEST 1V PORT
1 series · 1 of 1 positions shown · non-contrast
Comparison: 02/27/2010

CLINICAL DATA: Known hepatic and renal failure with difficulty
breathing

EXAM:
PORTABLE CHEST 1 VIEW

[AP]
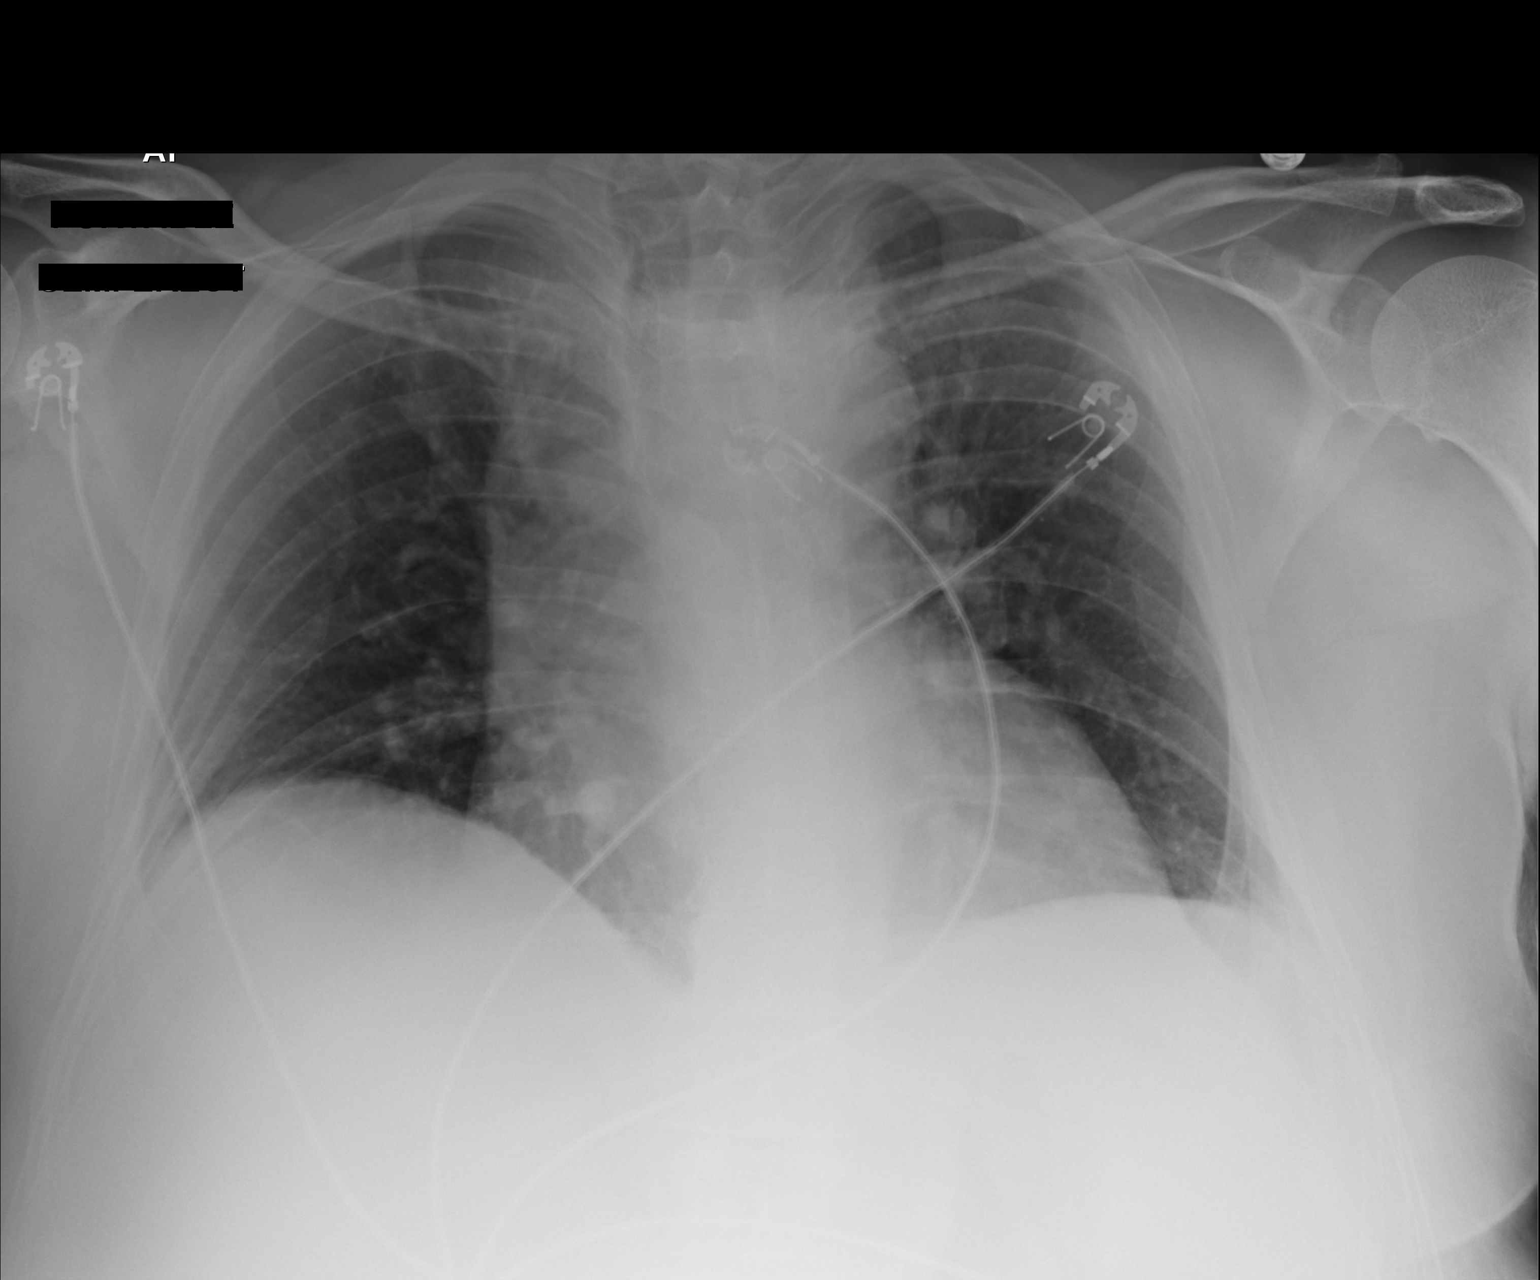

[1 of 1 positions shown; findings below may reference images not displayed]

FINDINGS: Cardiac shadow is mildly enlarged but accentuated by the portable
technique. The mediastinum is somewhat widened also likely related
to the portable technique and patient rotation to the right.
Postsurgical changes in the cervical spine are seen. The lungs
demonstrate no focal infiltrate or sizable effusion. No acute bony
abnormality is noted.
IMPRESSION: No definitive acute abnormality is seen.
